# Patient Record
Sex: Female | Born: 1950 | Race: Black or African American | Hispanic: No | Marital: Married | Smoking: Former smoker
Health system: Southern US, Community
[De-identification: ages and names within clinical notes are randomized; demographics above are authoritative.]

## PROBLEM LIST (undated history)

## (undated) DIAGNOSIS — B009 Herpesviral infection, unspecified: Secondary | ICD-10-CM

## (undated) DIAGNOSIS — R928 Other abnormal and inconclusive findings on diagnostic imaging of breast: Secondary | ICD-10-CM

## (undated) DIAGNOSIS — M1990 Unspecified osteoarthritis, unspecified site: Secondary | ICD-10-CM

## (undated) DIAGNOSIS — Z87891 Personal history of nicotine dependence: Secondary | ICD-10-CM

## (undated) DIAGNOSIS — I10 Essential (primary) hypertension: Secondary | ICD-10-CM

## (undated) DIAGNOSIS — G500 Trigeminal neuralgia: Secondary | ICD-10-CM

## (undated) DIAGNOSIS — E669 Obesity, unspecified: Secondary | ICD-10-CM

## (undated) DIAGNOSIS — Z889 Allergy status to unspecified drugs, medicaments and biological substances status: Secondary | ICD-10-CM

## (undated) DIAGNOSIS — E785 Hyperlipidemia, unspecified: Secondary | ICD-10-CM

## (undated) DIAGNOSIS — Z1211 Encounter for screening for malignant neoplasm of colon: Secondary | ICD-10-CM

## (undated) DIAGNOSIS — R339 Retention of urine, unspecified: Secondary | ICD-10-CM

## (undated) DIAGNOSIS — E119 Type 2 diabetes mellitus without complications: Secondary | ICD-10-CM

## (undated) DIAGNOSIS — D126 Benign neoplasm of colon, unspecified: Secondary | ICD-10-CM

## (undated) DIAGNOSIS — H409 Unspecified glaucoma: Secondary | ICD-10-CM

## (undated) DIAGNOSIS — N63 Unspecified lump in unspecified breast: Secondary | ICD-10-CM

## (undated) HISTORY — DX: Other abnormal and inconclusive findings on diagnostic imaging of breast: R92.8

## (undated) HISTORY — PX: OTHER SURGICAL HISTORY: SHX169

## (undated) HISTORY — DX: Obesity, unspecified: E66.9

## (undated) HISTORY — DX: Encounter for screening for malignant neoplasm of colon: Z12.11

## (undated) HISTORY — PX: ELBOW SURGERY: SHX618

## (undated) HISTORY — DX: Trigeminal neuralgia: G50.0

## (undated) HISTORY — PX: TOE SURGERY: SHX1073

## (undated) HISTORY — DX: Retention of urine, unspecified: R33.9

## (undated) HISTORY — DX: Hyperlipidemia, unspecified: E78.5

## (undated) HISTORY — PX: ABDOMINAL HYSTERECTOMY: SHX81

## (undated) HISTORY — PX: TONSILLECTOMY: SUR1361

## (undated) HISTORY — PX: ROTATOR CUFF REPAIR: SHX139

## (undated) HISTORY — PX: DESTRUCTION TRIGEMINAL NERVE VIA NEUROLYTIC AGENT: SUR416

## (undated) HISTORY — DX: Unspecified lump in unspecified breast: N63.0

## (undated) HISTORY — DX: Personal history of nicotine dependence: Z87.891

---

## 1978-05-17 HISTORY — PX: BREAST BIOPSY: SHX20

## 1988-05-17 HISTORY — PX: BREAST BIOPSY: SHX20

## 1996-05-17 HISTORY — PX: KNEE SURGERY: SHX244

## 1997-11-11 ENCOUNTER — Encounter: Admission: RE | Admit: 1997-11-11 | Discharge: 1997-11-11 | Payer: Self-pay | Admitting: *Deleted

## 1998-05-17 DIAGNOSIS — G500 Trigeminal neuralgia: Secondary | ICD-10-CM

## 1998-05-17 HISTORY — PX: BRAIN SURGERY: SHX531

## 1998-05-17 HISTORY — DX: Trigeminal neuralgia: G50.0

## 1998-06-13 ENCOUNTER — Encounter: Payer: Self-pay | Admitting: *Deleted

## 1998-06-13 ENCOUNTER — Ambulatory Visit (HOSPITAL_COMMUNITY): Admission: RE | Admit: 1998-06-13 | Discharge: 1998-06-13 | Payer: Self-pay | Admitting: *Deleted

## 1998-06-18 ENCOUNTER — Encounter: Payer: Self-pay | Admitting: *Deleted

## 1998-06-18 ENCOUNTER — Ambulatory Visit (HOSPITAL_COMMUNITY): Admission: RE | Admit: 1998-06-18 | Discharge: 1998-06-18 | Payer: Self-pay | Admitting: *Deleted

## 1998-06-26 ENCOUNTER — Ambulatory Visit (HOSPITAL_COMMUNITY): Admission: RE | Admit: 1998-06-26 | Discharge: 1998-06-26 | Payer: Self-pay | Admitting: General Surgery

## 1998-06-26 ENCOUNTER — Encounter: Payer: Self-pay | Admitting: General Surgery

## 1998-07-20 ENCOUNTER — Encounter: Payer: Self-pay | Admitting: Neurosurgery

## 1998-07-20 ENCOUNTER — Ambulatory Visit (HOSPITAL_COMMUNITY): Admission: RE | Admit: 1998-07-20 | Discharge: 1998-07-20 | Payer: Self-pay | Admitting: Neurosurgery

## 1998-09-15 ENCOUNTER — Encounter: Payer: Self-pay | Admitting: Emergency Medicine

## 1998-09-15 ENCOUNTER — Emergency Department (HOSPITAL_COMMUNITY): Admission: EM | Admit: 1998-09-15 | Discharge: 1998-09-15 | Payer: Self-pay | Admitting: Emergency Medicine

## 1998-12-02 ENCOUNTER — Encounter: Payer: Self-pay | Admitting: Neurosurgery

## 1998-12-04 ENCOUNTER — Inpatient Hospital Stay (HOSPITAL_COMMUNITY): Admission: RE | Admit: 1998-12-04 | Discharge: 1998-12-07 | Payer: Self-pay | Admitting: Neurosurgery

## 1999-06-12 ENCOUNTER — Emergency Department (HOSPITAL_COMMUNITY): Admission: EM | Admit: 1999-06-12 | Discharge: 1999-06-13 | Payer: Self-pay | Admitting: Emergency Medicine

## 1999-06-13 ENCOUNTER — Emergency Department (HOSPITAL_COMMUNITY): Admission: EM | Admit: 1999-06-13 | Discharge: 1999-06-14 | Payer: Self-pay | Admitting: Emergency Medicine

## 1999-06-14 ENCOUNTER — Encounter: Payer: Self-pay | Admitting: Emergency Medicine

## 1999-07-02 ENCOUNTER — Encounter: Payer: Self-pay | Admitting: Family Medicine

## 1999-07-02 ENCOUNTER — Encounter: Admission: RE | Admit: 1999-07-02 | Discharge: 1999-07-02 | Payer: Self-pay | Admitting: Family Medicine

## 1999-09-07 ENCOUNTER — Encounter: Admission: RE | Admit: 1999-09-07 | Discharge: 1999-10-19 | Payer: Self-pay | Admitting: *Deleted

## 2000-01-26 ENCOUNTER — Ambulatory Visit (HOSPITAL_BASED_OUTPATIENT_CLINIC_OR_DEPARTMENT_OTHER): Admission: RE | Admit: 2000-01-26 | Discharge: 2000-01-26 | Payer: Self-pay | Admitting: Orthopedic Surgery

## 2000-12-29 ENCOUNTER — Ambulatory Visit (HOSPITAL_BASED_OUTPATIENT_CLINIC_OR_DEPARTMENT_OTHER): Admission: RE | Admit: 2000-12-29 | Discharge: 2000-12-29 | Payer: Self-pay | Admitting: Orthopedic Surgery

## 2001-12-21 ENCOUNTER — Encounter: Payer: Self-pay | Admitting: Family Medicine

## 2001-12-21 ENCOUNTER — Ambulatory Visit (HOSPITAL_COMMUNITY): Admission: RE | Admit: 2001-12-21 | Discharge: 2001-12-21 | Payer: Self-pay | Admitting: Family Medicine

## 2003-02-21 ENCOUNTER — Encounter: Admission: RE | Admit: 2003-02-21 | Discharge: 2003-04-04 | Payer: Self-pay | Admitting: Orthopedic Surgery

## 2003-04-10 ENCOUNTER — Ambulatory Visit (HOSPITAL_COMMUNITY): Admission: RE | Admit: 2003-04-10 | Discharge: 2003-04-10 | Payer: Self-pay | Admitting: Family Medicine

## 2003-06-13 ENCOUNTER — Ambulatory Visit (HOSPITAL_COMMUNITY): Admission: RE | Admit: 2003-06-13 | Discharge: 2003-06-13 | Payer: Self-pay | Admitting: Nurse Practitioner

## 2004-01-24 ENCOUNTER — Ambulatory Visit: Payer: Self-pay | Admitting: *Deleted

## 2004-02-06 ENCOUNTER — Ambulatory Visit: Payer: Self-pay | Admitting: Nurse Practitioner

## 2004-02-27 ENCOUNTER — Ambulatory Visit: Payer: Self-pay | Admitting: *Deleted

## 2004-03-16 ENCOUNTER — Ambulatory Visit: Payer: Self-pay | Admitting: Nurse Practitioner

## 2004-03-27 ENCOUNTER — Ambulatory Visit: Payer: Self-pay | Admitting: Nurse Practitioner

## 2004-04-06 ENCOUNTER — Ambulatory Visit: Payer: Self-pay | Admitting: Nurse Practitioner

## 2004-04-15 ENCOUNTER — Ambulatory Visit (HOSPITAL_COMMUNITY): Admission: RE | Admit: 2004-04-15 | Discharge: 2004-04-15 | Payer: Self-pay | Admitting: Family Medicine

## 2004-07-02 ENCOUNTER — Ambulatory Visit: Payer: Self-pay | Admitting: Nurse Practitioner

## 2004-07-07 ENCOUNTER — Ambulatory Visit: Payer: Self-pay | Admitting: Nurse Practitioner

## 2004-07-09 ENCOUNTER — Ambulatory Visit: Payer: Self-pay | Admitting: Nurse Practitioner

## 2004-08-03 ENCOUNTER — Ambulatory Visit: Payer: Self-pay | Admitting: Nurse Practitioner

## 2004-08-25 ENCOUNTER — Ambulatory Visit: Payer: Self-pay | Admitting: Nurse Practitioner

## 2004-11-05 ENCOUNTER — Ambulatory Visit: Payer: Self-pay | Admitting: Nurse Practitioner

## 2005-04-14 ENCOUNTER — Ambulatory Visit: Payer: Self-pay | Admitting: Nurse Practitioner

## 2005-04-23 ENCOUNTER — Ambulatory Visit (HOSPITAL_COMMUNITY): Admission: RE | Admit: 2005-04-23 | Discharge: 2005-04-23 | Payer: Self-pay | Admitting: Internal Medicine

## 2005-07-19 ENCOUNTER — Ambulatory Visit: Payer: Self-pay | Admitting: Nurse Practitioner

## 2005-07-27 ENCOUNTER — Ambulatory Visit: Payer: Self-pay | Admitting: Nurse Practitioner

## 2005-08-11 ENCOUNTER — Emergency Department (HOSPITAL_COMMUNITY): Admission: EM | Admit: 2005-08-11 | Discharge: 2005-08-11 | Payer: Self-pay | Admitting: Family Medicine

## 2006-01-27 ENCOUNTER — Ambulatory Visit: Payer: Self-pay | Admitting: Nurse Practitioner

## 2006-01-29 ENCOUNTER — Ambulatory Visit (HOSPITAL_COMMUNITY): Admission: RE | Admit: 2006-01-29 | Discharge: 2006-01-29 | Payer: Self-pay | Admitting: Nurse Practitioner

## 2006-02-25 ENCOUNTER — Ambulatory Visit: Payer: Self-pay | Admitting: Nurse Practitioner

## 2006-02-28 ENCOUNTER — Ambulatory Visit (HOSPITAL_COMMUNITY): Admission: RE | Admit: 2006-02-28 | Discharge: 2006-02-28 | Payer: Self-pay | Admitting: Family Medicine

## 2006-05-17 HISTORY — PX: COLONOSCOPY: SHX174

## 2006-05-30 ENCOUNTER — Ambulatory Visit (HOSPITAL_COMMUNITY): Admission: RE | Admit: 2006-05-30 | Discharge: 2006-05-30 | Payer: Self-pay | Admitting: Nurse Practitioner

## 2006-09-14 ENCOUNTER — Encounter (INDEPENDENT_AMBULATORY_CARE_PROVIDER_SITE_OTHER): Payer: Self-pay | Admitting: Nurse Practitioner

## 2006-09-14 ENCOUNTER — Ambulatory Visit: Payer: Self-pay | Admitting: Nurse Practitioner

## 2006-10-04 ENCOUNTER — Ambulatory Visit: Payer: Self-pay | Admitting: Nurse Practitioner

## 2006-12-22 ENCOUNTER — Ambulatory Visit: Payer: Self-pay | Admitting: Internal Medicine

## 2006-12-27 ENCOUNTER — Emergency Department (HOSPITAL_COMMUNITY): Admission: EM | Admit: 2006-12-27 | Discharge: 2006-12-27 | Payer: Self-pay | Admitting: Emergency Medicine

## 2007-01-05 ENCOUNTER — Ambulatory Visit: Payer: Self-pay | Admitting: Family Medicine

## 2007-02-01 ENCOUNTER — Encounter (INDEPENDENT_AMBULATORY_CARE_PROVIDER_SITE_OTHER): Payer: Self-pay | Admitting: *Deleted

## 2007-04-17 ENCOUNTER — Ambulatory Visit (HOSPITAL_COMMUNITY): Admission: RE | Admit: 2007-04-17 | Discharge: 2007-04-17 | Payer: Self-pay | Admitting: Internal Medicine

## 2007-04-17 ENCOUNTER — Ambulatory Visit: Payer: Self-pay | Admitting: Internal Medicine

## 2007-04-26 ENCOUNTER — Ambulatory Visit: Payer: Self-pay | Admitting: Family Medicine

## 2007-05-23 ENCOUNTER — Ambulatory Visit: Payer: Self-pay | Admitting: Family Medicine

## 2007-06-15 ENCOUNTER — Ambulatory Visit (HOSPITAL_COMMUNITY): Admission: RE | Admit: 2007-06-15 | Discharge: 2007-06-15 | Payer: Self-pay | Admitting: Family Medicine

## 2007-06-29 ENCOUNTER — Encounter: Admission: RE | Admit: 2007-06-29 | Discharge: 2007-06-29 | Payer: Self-pay | Admitting: Family Medicine

## 2007-07-04 ENCOUNTER — Ambulatory Visit: Payer: Self-pay | Admitting: Family Medicine

## 2007-07-06 ENCOUNTER — Ambulatory Visit (HOSPITAL_COMMUNITY): Admission: RE | Admit: 2007-07-06 | Discharge: 2007-07-06 | Payer: Self-pay | Admitting: Family Medicine

## 2007-12-20 ENCOUNTER — Encounter: Admission: RE | Admit: 2007-12-20 | Discharge: 2007-12-20 | Payer: Self-pay | Admitting: Family Medicine

## 2008-05-17 HISTORY — PX: HAND SURGERY: SHX662

## 2008-06-03 ENCOUNTER — Ambulatory Visit: Payer: Self-pay | Admitting: Gastroenterology

## 2008-06-21 ENCOUNTER — Encounter: Admission: RE | Admit: 2008-06-21 | Discharge: 2008-06-21 | Payer: Self-pay | Admitting: Family Medicine

## 2009-05-17 DIAGNOSIS — R339 Retention of urine, unspecified: Secondary | ICD-10-CM

## 2009-05-17 HISTORY — PX: HAND SURGERY: SHX662

## 2009-05-17 HISTORY — DX: Retention of urine, unspecified: R33.9

## 2009-07-18 ENCOUNTER — Encounter: Admission: RE | Admit: 2009-07-18 | Discharge: 2009-07-18 | Payer: Self-pay | Admitting: Family Medicine

## 2010-06-07 ENCOUNTER — Encounter: Payer: Self-pay | Admitting: Nurse Practitioner

## 2010-08-03 ENCOUNTER — Ambulatory Visit: Payer: Self-pay | Admitting: Family Medicine

## 2010-10-02 ENCOUNTER — Ambulatory Visit: Payer: Self-pay | Admitting: Orthopedic Surgery

## 2010-10-02 NOTE — Op Note (Signed)
. [facility]  Patient:    Desiree Oneal, Desiree Oneal                     MRN: [id number] Proc. Date: 01/26/00 Adm. Date:  [id number] Attending:  Ronne Binning CC:         Nicki Reaper, M.D. (2 copies)   Operative Report  PREOPERATIVE DIAGNOSIS:  Carpal tunnel syndrome, left hand.  POSTOPERATIVE DIAGNOSIS:  Carpal tunnel syndrome, left hand.  OPERATION:  Decompression, left median nerve.  SURGEON:  Nicki Reaper, M.D.  ASSISTANT:  Joaquin Courts, R.N.  ANESTHESIA:  Forearm-based IV regional.  ANESTHESIOLOGIST:  Cliffton Asters. Ivin Booty, M.D.  HISTORY:  The patient is a 60 year old female with a history of carpal tunnel syndrome.  EMG nerve conductions were positive which has not responded to conservative treatment.  DESCRIPTION OF PROCEDURE:  The patient was brought to the operating room where a forearm-based IV regional anesthetic was carried out without difficultly. She was prepped and draped using Betadine scrub and solution with left arm free.  A longitudinal incision was made in the palm and carried down through subcutaneous tissue.  Bleeders were electrocauterized.  Palmar fascia was split, superficial palmar arch identified.  The flexor tendon to the ring and little finger identified to the ulnar side of the median nerve.  The carpal retinaculum was incised with sharp dissection.  A right angle and Sewall retractor were placed between skin and forearm fascia and the fascia released for approximately 3 cm proximal to the wrist crease under direct vision.  The canal was explored.  No further lesions were identified.  The wound was irrigated.  The skin was closed with interrupted 5-0 nylon sutures.  A sterile compressive dressing and splint was applied.  The patient tolerated the procedure well and was taken to the recovery room for observation in satisfactory condition.  She is discharged home to return to the [facility] in [place] in one week on  Vicodin and Keflex.  She was advised she could return to one-handed work. DD:  01/26/00 TD:  01/27/00 Job: [id number]

## 2010-10-06 LAB — PATHOLOGY REPORT

## 2011-05-18 DIAGNOSIS — N63 Unspecified lump in unspecified breast: Secondary | ICD-10-CM

## 2011-05-18 HISTORY — DX: Unspecified lump in unspecified breast: N63.0

## 2012-02-08 ENCOUNTER — Ambulatory Visit: Payer: Self-pay | Admitting: Family Medicine

## 2012-02-11 ENCOUNTER — Ambulatory Visit: Payer: Self-pay | Admitting: Family Medicine

## 2012-06-09 HISTORY — PX: TRIGGER FINGER RELEASE: SHX641

## 2012-07-05 ENCOUNTER — Ambulatory Visit: Payer: Self-pay | Admitting: General Surgery

## 2012-08-21 ENCOUNTER — Ambulatory Visit: Payer: Self-pay | Admitting: Orthopedic Surgery

## 2012-10-25 ENCOUNTER — Encounter: Payer: Self-pay | Admitting: *Deleted

## 2012-10-25 ENCOUNTER — Ambulatory Visit: Payer: Self-pay | Admitting: Physical Medicine and Rehabilitation

## 2013-02-22 ENCOUNTER — Ambulatory Visit: Payer: Self-pay | Admitting: General Surgery

## 2013-03-13 ENCOUNTER — Ambulatory Visit: Payer: Self-pay | Admitting: General Surgery

## 2013-03-13 ENCOUNTER — Encounter: Payer: Self-pay | Admitting: General Surgery

## 2013-04-09 ENCOUNTER — Ambulatory Visit: Payer: Self-pay | Admitting: General Surgery

## 2013-04-11 ENCOUNTER — Ambulatory Visit (INDEPENDENT_AMBULATORY_CARE_PROVIDER_SITE_OTHER): Payer: 59 | Admitting: General Surgery

## 2013-04-11 ENCOUNTER — Encounter: Payer: Self-pay | Admitting: General Surgery

## 2013-04-11 VITALS — BP 150/84 | HR 80 | Resp 12 | Ht 64.0 in | Wt 206.0 lb

## 2013-04-11 DIAGNOSIS — Z1239 Encounter for other screening for malignant neoplasm of breast: Secondary | ICD-10-CM

## 2013-04-11 DIAGNOSIS — Z803 Family history of malignant neoplasm of breast: Secondary | ICD-10-CM | POA: Insufficient documentation

## 2013-04-11 NOTE — Patient Instructions (Signed)
Patient to return in October 2015 bilateral screening mammogram. Patient to conutine to do monthly self breast checks.

## 2013-04-11 NOTE — Progress Notes (Signed)
Patient ID: Desiree Oneal, female   DOB: July 28, 1950, 62 y.o.   MRN: [id number]  Chief Complaint  Patient presents with  . Follow-up    mammogram    HPI Desiree Oneal is a 62 y.o. female who presents for a breast evaluation. The most recent mammogram was done on 03/13/13.Patient has small left breast nodule that has not change Patient does perform regular self breast checks and gets regular mammograms done.  Patient has a sister with breast cancer diagnosed recently   HPI  Past Medical History  Diagnosis Date  . Personal history of tobacco use, presenting hazards to health   . Abnormal mammogram, unspecified   . Lump or mass in breast 2013  . Special screening for malignant neoplasms, colon   . Obesity, unspecified     Past Surgical History  Procedure Laterality Date  . Colonoscopy  2008    Dr. Mechele Collin  . Breast biopsy Right 1980    benign  . Breast biopsy Left 1990    benign  . Knee surgery Right 1998  . Brain surgery  2000  . Hand surgery Left 2010  . Hand surgery Right 2011    Family History  Problem Relation Age of Onset  . Breast cancer Sister   . Breast cancer Maternal Grandmother   . Breast cancer Cousin     maternal    Social History History  Substance Use Topics  . Smoking status: Former Smoker -- 0.50 packs/day for 20 years  . Smokeless tobacco: Never Used  . Alcohol Use: No    No Known Allergies  Current Outpatient Prescriptions  Medication Sig Dispense Refill  . acyclovir (ZOVIRAX) 200 MG capsule Take 200 mg by mouth 2 (two) times daily.       . carbamazepine (TEGRETOL) 200 MG tablet Take 200 mg by mouth 3 (three) times daily.       Marland Kitchen oxybutynin (DITROPAN-XL) 10 MG 24 hr tablet Take 10 mg by mouth at bedtime.       . pravastatin (PRAVACHOL) 40 MG tablet       . zolpidem (AMBIEN) 10 MG tablet Take 10 mg by mouth.        No current facility-administered medications for this visit.    Review of Systems Review of Systems  Constitutional:  Negative.   Respiratory: Negative.   Cardiovascular: Negative.     Blood pressure 150/84, pulse 80, resp. rate 12, height 5\' 4"  (1.626 m), weight 206 lb (93.441 kg).  Physical Exam Physical Exam  Vitals reviewed. Constitutional: She is oriented to person, place, and time. She appears well-developed and well-nourished.  Eyes: Conjunctivae are normal. No scleral icterus.  Neck: Neck supple. No mass and no thyromegaly present.  Cardiovascular: Normal rate, regular rhythm and normal heart sounds.   Pulmonary/Chest: Breath sounds normal. Right breast exhibits no inverted nipple, no mass, no nipple discharge, no skin change and no tenderness. Left breast exhibits no inverted nipple, no mass, no nipple discharge, no skin change and no tenderness.  Abdominal: Soft. Normal appearance and bowel sounds are normal.  Lymphadenopathy:    She has no cervical adenopathy.    She has no axillary adenopathy.  Neurological: She is alert and oriented to person, place, and time.  Skin: Skin is warm and dry.    Data Reviewed Mammogram Reviewed-stable small nodule left breast  Assessment    Stable exam     Plan    Patient to return in October 2015 bilateral screening mammogram.  Aela Bohan G 04/11/2013, 6:16 PM

## 2014-02-06 ENCOUNTER — Ambulatory Visit: Payer: 59 | Admitting: Podiatry

## 2014-02-11 ENCOUNTER — Encounter: Payer: Self-pay | Admitting: Podiatry

## 2014-02-11 ENCOUNTER — Ambulatory Visit (INDEPENDENT_AMBULATORY_CARE_PROVIDER_SITE_OTHER): Payer: 59 | Admitting: Podiatry

## 2014-02-11 VITALS — BP 116/86 | HR 82 | Resp 12

## 2014-02-11 DIAGNOSIS — B351 Tinea unguium: Secondary | ICD-10-CM

## 2014-02-11 DIAGNOSIS — B353 Tinea pedis: Secondary | ICD-10-CM

## 2014-02-11 NOTE — Progress Notes (Signed)
   Subjective:    Patient ID: Desiree Oneal, female    DOB: 04-18-1951, 63 y.o.   MRN: [id number]  HPI N-ITCHING, WHITE DISCOLORATION L-LT FOOT BETWEEN 1ST AND 2ND D- 4 MONTHS C-WORSE O-SLOWLY A-SHOES T-NEOSPORIN, antifungal medication and cortisone cream with no improvement   N- THICK, DISCOLORATION L-RT FOOT 1-4 TOENAILS D-1 YEAR O-SLOWLY C-WORSE A-NONE T-NO TREATMENT   Review of Systems  All other systems reviewed and are negative.      Objective:   Physical Exam Orientated x3  Vascular: DP and PT pulses 2/4 bilaterally  Neurological: Sensation to 10 g monofilament wire intact 4/5 bilaterally Vibratory sensation intact bilaterally Ankle reflex equal and reactive bilaterally  Dermatological: Macerated white  skin first left interdigital  space Toenails 1-5 are hypertrophic, elongated, discolored Surgical incision fifth right toe       Assessment & Plan:   Assessment: Probable yeast infection for first left web space Onychomycoses one-5 right  Plan: Dispense Castellani paint to apply to the first web space twice a day Nail fragments obtained right hallux for PAS and fungal culture  Notify patient on receipt of lab

## 2014-02-11 NOTE — Patient Instructions (Signed)
Apply Castellaini paint twice a day to the webspace between the left great toe and second toe

## 2014-02-12 ENCOUNTER — Encounter: Payer: Self-pay | Admitting: Podiatry

## 2014-02-22 ENCOUNTER — Telehealth: Payer: Self-pay | Admitting: *Deleted

## 2014-02-22 ENCOUNTER — Emergency Department (HOSPITAL_COMMUNITY)
Admission: EM | Admit: 2014-02-22 | Discharge: 2014-02-22 | Disposition: A | Discharge status: Home / Self Care | Payer: 59

## 2014-02-22 NOTE — Telephone Encounter (Signed)
I would like to see if I can get an antibiotic to clear up my infection on my feet.  I was seen a couple of weeks ago.  I'm supposed to have a fungus and with this burgundy dye I'm putting in between my toes.  It's spreading to the other toes and it is splitting real bad on top.  Is there any way possible to get something for infection?  Please give me a call back.  I called and left her a message to call us and schedule an appointment for today to see another doctor.  Dr. Amalia Hailey is not here today.

## 2014-02-22 NOTE — ED Notes (Signed)
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 Both expressed understanding and left via POV.

## 2014-02-28 ENCOUNTER — Ambulatory Visit (INDEPENDENT_AMBULATORY_CARE_PROVIDER_SITE_OTHER): Payer: 59 | Admitting: Podiatry

## 2014-02-28 ENCOUNTER — Encounter: Payer: Self-pay | Admitting: Podiatry

## 2014-02-28 VITALS — BP 134/80 | HR 85 | Resp 16

## 2014-02-28 DIAGNOSIS — B372 Candidiasis of skin and nail: Secondary | ICD-10-CM

## 2014-02-28 DIAGNOSIS — L03119 Cellulitis of unspecified part of limb: Secondary | ICD-10-CM

## 2014-02-28 LAB — CBC WITH DIFFERENTIAL/PLATELET
Basophils Absolute: 0 10*3/uL (ref 0.0–0.1)
Basophils Relative: 0 % (ref 0–1)
Eosinophils Absolute: 0.1 10*3/uL (ref 0.0–0.7)
Eosinophils Relative: 2 % (ref 0–5)
HEMATOCRIT: 39.4 % (ref 36.0–46.0)
HEMOGLOBIN: 13.2 g/dL (ref 12.0–15.0)
Lymphocytes Relative: 52 % — ABNORMAL HIGH (ref 12–46)
Lymphs Abs: 3.6 10*3/uL (ref 0.7–4.0)
MCH: 29 pg (ref 26.0–34.0)
MCHC: 33.5 g/dL (ref 30.0–36.0)
MCV: 86.6 fL (ref 78.0–100.0)
MONOS PCT: 8 % (ref 3–12)
Monocytes Absolute: 0.6 10*3/uL (ref 0.1–1.0)
NEUTROS ABS: 2.6 10*3/uL (ref 1.7–7.7)
Neutrophils Relative %: 38 % — ABNORMAL LOW (ref 43–77)
Platelets: 305 10*3/uL (ref 150–400)
RBC: 4.55 MIL/uL (ref 3.87–5.11)
RDW: 14.7 % (ref 11.5–15.5)
WBC: 6.9 10*3/uL (ref 4.0–10.5)

## 2014-02-28 MED ORDER — METHYLPREDNISOLONE 4 MG PO KIT: PACK | ORAL | Status: DC

## 2014-02-28 MED ORDER — CEPHALEXIN 500 MG PO CAPS: 500.0000 mg | ORAL_CAPSULE | Freq: Three times a day (TID) | ORAL | Status: DC

## 2014-02-28 MED ORDER — TERBINAFINE HCL 250 MG PO TABS: 250.0000 mg | ORAL_TABLET | Freq: Every day | ORAL | Status: DC

## 2014-03-03 NOTE — Progress Notes (Signed)
Subjective:     Patient ID: Desiree Oneal, female   DOB: 01-03-1951, 63 y.o.   MRN: [id number]  HPI patient presents stating she still getting some irritation between the big toe and the second toe of her left foot that is localized with no proximal redness that is irritating to her and she wanted to see what else can be done   Review of Systems     Objective:   Physical Exam Neurovascular status intact with an area of crusted tissue between the hallux and second toe left foot that has white type discoloration but no proximal edema erythema or drainage noted.    Assessment:     Probable fungal infection of the left first interspace with no indications currently of bacterial infection the patient is very worried about the appearance of this area    Plan:     At this time I educated her on this type of infection and advised that we try to improve it and if it does not get better she needs to seek a dermatologist for opinion. Today I did place her on Lamisil 250 mg daily a steroid Dosepak for the itching and inflammation she is experiencing and as precautionary measure I did place her on cephalexin 500 mg 3 times a day. She just had a liver function study done from her physician and I did go ahead and I send her out for a CBC to make sure there is no elevated count. I gave her strict instructions if this does not improve or were to get any proximal edema erythema or drainage to contact us immediately go to the emergency room for check

## 2014-03-11 ENCOUNTER — Encounter: Payer: Self-pay | Admitting: Podiatry

## 2014-03-18 ENCOUNTER — Encounter: Payer: Self-pay | Admitting: Podiatry

## 2014-03-21 ENCOUNTER — Ambulatory Visit: Payer: 59 | Admitting: General Surgery

## 2014-03-28 ENCOUNTER — Encounter: Payer: Self-pay | Admitting: *Deleted

## 2014-04-19 ENCOUNTER — Telehealth: Payer: Self-pay | Admitting: *Deleted

## 2014-04-19 ENCOUNTER — Other Ambulatory Visit: Payer: Self-pay | Admitting: *Deleted

## 2014-04-19 DIAGNOSIS — B353 Tinea pedis: Secondary | ICD-10-CM

## 2014-04-19 MED ORDER — CEPHALEXIN 500 MG PO CAPS: 500.0000 mg | ORAL_CAPSULE | Freq: Three times a day (TID) | ORAL | Status: DC

## 2014-04-19 MED ORDER — METHYLPREDNISOLONE 4 MG PO KIT: PACK | ORAL | Status: DC

## 2014-04-19 MED ORDER — TERBINAFINE HCL 250 MG PO TABS: 250.0000 mg | ORAL_TABLET | Freq: Every day | ORAL | Status: DC

## 2014-04-19 NOTE — Telephone Encounter (Signed)
Patient called stating that her foot is splitting again and is sore. What should she do?

## 2014-04-19 NOTE — Telephone Encounter (Signed)
Returned call-advised that she needed to see a dermatologist since the problem kept recurring. Told her we would work on the referral and will call in the same meds Dr. Paulla Dolly gave her before in the meantime. Will cantact her with referral appointment and send in cephalexin, lamisil and dose pack to [facility].

## 2014-04-26 ENCOUNTER — Telehealth: Payer: Self-pay | Admitting: *Deleted

## 2014-04-26 NOTE — Telephone Encounter (Signed)
Per Dr. Paulla Dolly, I called to schedule the patient an appointment at [facility] for [place].  "We don't have anything until mid January.  Is that okay?"  I told her that is fine.  "We can get her in on 05/29/2014 at 10:30 am with Dr. Denna Haggard." I told her that is fine.  I called and left the patient a message about the appointment.  Call if you have any questions.

## 2014-05-24 ENCOUNTER — Other Ambulatory Visit: Payer: Self-pay | Admitting: Podiatry

## 2014-11-13 ENCOUNTER — Encounter: Payer: Self-pay | Admitting: General Surgery

## 2014-11-13 ENCOUNTER — Telehealth: Payer: Self-pay | Admitting: *Deleted

## 2014-11-13 NOTE — Telephone Encounter (Signed)
She had called complaining of left breast pain and was asking which breast had had been of concern in the past. Chart reviewed. Appt made for 11-29-14 as requested.

## 2014-11-26 ENCOUNTER — Ambulatory Visit: Payer: Self-pay | Admitting: General Surgery

## 2014-12-12 ENCOUNTER — Ambulatory Visit: Payer: Self-pay | Admitting: General Surgery

## 2015-01-21 ENCOUNTER — Encounter: Payer: Self-pay | Admitting: *Deleted

## 2015-02-05 ENCOUNTER — Ambulatory Visit: Payer: Self-pay | Admitting: General Surgery

## 2015-02-25 ENCOUNTER — Ambulatory Visit: Payer: Self-pay | Admitting: General Surgery

## 2015-02-27 ENCOUNTER — Ambulatory Visit: Payer: Self-pay | Admitting: General Surgery

## 2015-02-27 ENCOUNTER — Ambulatory Visit (INDEPENDENT_AMBULATORY_CARE_PROVIDER_SITE_OTHER): Payer: BC Managed Care – PPO | Admitting: General Surgery

## 2015-02-27 ENCOUNTER — Encounter: Payer: Self-pay | Admitting: General Surgery

## 2015-02-27 ENCOUNTER — Other Ambulatory Visit: Payer: BC Managed Care – PPO

## 2015-02-27 VITALS — BP 144/82 | HR 84 | Resp 14 | Ht 63.0 in | Wt 215.0 lb

## 2015-02-27 DIAGNOSIS — Z803 Family history of malignant neoplasm of breast: Secondary | ICD-10-CM | POA: Diagnosis not present

## 2015-02-27 DIAGNOSIS — Z1239 Encounter for other screening for malignant neoplasm of breast: Secondary | ICD-10-CM | POA: Diagnosis not present

## 2015-02-27 DIAGNOSIS — N644 Mastodynia: Secondary | ICD-10-CM

## 2015-02-27 NOTE — Patient Instructions (Signed)
The patient is aware to call back for any questions or concerns.  

## 2015-02-27 NOTE — Progress Notes (Signed)
Patient ID: Desiree Oneal, female   DOB: 07-16-1950, 64 y.o.   MRN: [id number]  Chief Complaint  Patient presents with  . Other    left breast pain    HPI Desiree Oneal is a 64 y.o. female here today for a evaluation left breast pain for several months described as "soreness". She states the pain comes and goes. She has been lifting more and traveling more. She feels like the left breast is larger. She feels "cysts" but denies any single noticeable lump. Her last mammogram was in 2014.  HPI  Past Medical History  Diagnosis Date  . Personal history of tobacco use, presenting hazards to health   . Abnormal mammogram, unspecified   . Lump or mass in breast 2013  . Special screening for malignant neoplasms, colon   . Obesity, unspecified     Past Surgical History  Procedure Laterality Date  . Colonoscopy  2008    Dr. Vira Oneal  . Breast biopsy Right 1980    benign  . Breast biopsy Left 1990    benign  . Knee surgery Right 1998  . Brain surgery  2000  . Hand surgery Left 2010  . Hand surgery Right 2011    Family History  Problem Relation Age of Onset  . Breast cancer Maternal Grandmother   . Breast cancer Cousin     maternal    Social History Social History  Substance Use Topics  . Smoking status: Former Smoker -- 0.50 packs/day for 20 years  . Smokeless tobacco: Never Used  . Alcohol Use: No    No Known Allergies  Current Outpatient Prescriptions  Medication Sig Dispense Refill  . acyclovir (ZOVIRAX) 200 MG capsule Take by mouth.    . meloxicam (MOBIC) 15 MG tablet Take by mouth.    . oxybutynin (DITROPAN-XL) 10 MG 24 hr tablet Take 10 mg by mouth at bedtime.     . pravastatin (PRAVACHOL) 40 MG tablet Take by mouth.    . pregabalin (LYRICA) 50 MG capsule Take 50 mg by mouth as needed.    . zolpidem (AMBIEN) 10 MG tablet Take 10 mg by mouth.      No current facility-administered medications for this visit.    Review of Systems Review of Systems   Constitutional: Negative.   Respiratory: Negative.   Cardiovascular: Negative.     Blood pressure 144/82, pulse 84, resp. rate 14, height 5\' 3"  (1.6 m), weight 215 lb (97.523 kg).  Physical Exam Physical Exam  Constitutional: She is oriented to person, place, and time. She appears well-developed and well-nourished.  HENT:  Mouth/Throat: Oropharynx is clear and moist.  Eyes: Conjunctivae are normal. No scleral icterus.  Neck: Neck supple.  Cardiovascular: Normal rate, regular rhythm and normal heart sounds.   Pulmonary/Chest: Effort normal and breath sounds normal. Right breast exhibits no inverted nipple, no mass, no nipple discharge, no skin change and no tenderness. Left breast exhibits tenderness. Left breast exhibits no inverted nipple, no mass, no nipple discharge and no skin change.  Left breast: Tenderness and palpable 3-4 cm area of fullness/firm tissue stemming from subareolar region superiorly at 12 oclock location (near scar from previous incision). No defined mass.   Lymphadenopathy:    She has no cervical adenopathy.    She has no axillary adenopathy.  Neurological: She is alert and oriented to person, place, and time.  Skin: Skin is warm and dry.  Psychiatric: Her behavior is normal.    Data Reviewed Previous note.  Assessment    Targeted ultrasound today over the palpable area shows 4 mm focal area of shadowing, seen only on the anti-radial view. This is of doubtful significance and needs a short term follow-up, assuming mammogram is normal.    Plan    Bilateral screening mammogram. If normal, follow up in 3 months.  Continue self breast exams. Call office for any new breast issues or concerns.        PCP:  Desiree Oneal 02/28/2015, 4:00 PM

## 2015-02-28 ENCOUNTER — Encounter: Payer: Self-pay | Admitting: General Surgery

## 2015-03-03 ENCOUNTER — Ambulatory Visit
Admission: RE | Admit: 2015-03-03 | Discharge: 2015-03-03 | Disposition: A | Discharge status: Home / Self Care | Payer: BC Managed Care – PPO | Source: Ambulatory Visit | Attending: General Surgery | Admitting: General Surgery

## 2015-03-03 DIAGNOSIS — Z1239 Encounter for other screening for malignant neoplasm of breast: Secondary | ICD-10-CM

## 2015-03-03 DIAGNOSIS — Z1231 Encounter for screening mammogram for malignant neoplasm of breast: Secondary | ICD-10-CM | POA: Insufficient documentation

## 2015-05-15 ENCOUNTER — Ambulatory Visit: Payer: BC Managed Care – PPO | Admitting: Family Medicine

## 2015-05-28 ENCOUNTER — Telehealth: Payer: Self-pay | Admitting: *Deleted

## 2015-05-28 NOTE — Telephone Encounter (Signed)
Fax refill request for Terbinafine.  Pt has not been seen by [facility] doctor in over 1 year, rx denied until pt is reevaluated.  Return fax.

## 2015-05-30 DIAGNOSIS — M7520 Bicipital tendinitis, unspecified shoulder: Secondary | ICD-10-CM | POA: Insufficient documentation

## 2015-06-04 ENCOUNTER — Ambulatory Visit: Payer: Self-pay | Admitting: General Surgery

## 2015-06-18 ENCOUNTER — Other Ambulatory Visit: Payer: BC Managed Care – PPO

## 2015-06-18 ENCOUNTER — Encounter: Payer: Self-pay | Admitting: General Surgery

## 2015-06-18 ENCOUNTER — Ambulatory Visit (INDEPENDENT_AMBULATORY_CARE_PROVIDER_SITE_OTHER): Payer: BC Managed Care – PPO | Admitting: General Surgery

## 2015-06-18 DIAGNOSIS — N63 Unspecified lump in breast: Secondary | ICD-10-CM

## 2015-06-18 DIAGNOSIS — N6320 Unspecified lump in the left breast, unspecified quadrant: Secondary | ICD-10-CM

## 2015-06-18 NOTE — Progress Notes (Signed)
Patient ID: Desiree Oneal, female   DOB: 03-16-1951, 65 y.o.   MRN: [id number]  Chief Complaint  Patient presents with  . Follow-up    breast check    HPI Desiree Oneal is a 65 y.o. female here today following up for her three month left breast thickening. Patient states she has not noticed any changes and doing well.  HPI I have reviewed the history of present illness with the patient.  Past Medical History  Diagnosis Date  . Personal history of tobacco use, presenting hazards to health   . Abnormal mammogram, unspecified   . Lump or mass in breast 2013  . Special screening for malignant neoplasms, colon   . Obesity, unspecified     Past Surgical History  Procedure Laterality Date  . Colonoscopy  2008    Dr. Vira Agar  . Breast biopsy Right 1980    benign  . Breast biopsy Left 1990    benign  . Knee surgery Right 1998  . Brain surgery  2000  . Hand surgery Left 2010  . Hand surgery Right 2011    Family History  Problem Relation Age of Onset  . Breast cancer Maternal Grandmother   . Breast cancer Cousin     maternal    Social History Social History  Substance Use Topics  . Smoking status: Former Smoker -- 0.50 packs/day for 20 years  . Smokeless tobacco: Never Used  . Alcohol Use: No    No Known Allergies  Current Outpatient Prescriptions  Medication Sig Dispense Refill  . acyclovir (ZOVIRAX) 200 MG capsule Take by mouth.    . carbamazepine (TEGRETOL) 200 MG tablet Take by mouth.    . oxybutynin (DITROPAN-XL) 10 MG 24 hr tablet Take 10 mg by mouth at bedtime.     . pravastatin (PRAVACHOL) 40 MG tablet Take by mouth.    . pregabalin (LYRICA) 50 MG capsule Take 50 mg by mouth as needed.    . valACYclovir (VALTREX) 500 MG tablet Take by mouth.    . zolpidem (AMBIEN) 10 MG tablet Take by mouth.     No current facility-administered medications for this visit.    Review of Systems Review of Systems  Constitutional: Negative.   Respiratory: Negative.    Cardiovascular: Negative.     Blood pressure 134/62, pulse 76, resp. rate 14, height 5\' 3"  (1.6 m), weight 211 lb (95.709 kg).  Physical Exam Physical Exam  Constitutional: She is oriented to person, place, and time. She appears well-developed and well-nourished.  Cardiovascular: Normal rate and regular rhythm.   Pulmonary/Chest: Right breast exhibits no inverted nipple, no mass, no nipple discharge, no skin change and no tenderness. Left breast exhibits mass and tenderness. Left breast exhibits no inverted nipple, no nipple discharge and no skin change.  Previously noted thickening superior aspect close to the areola feels less well-defined, is still firm and somewhat irregular compared with the rest of the breast tissue.  Lymphadenopathy:    She has no cervical adenopathy.    She has no axillary adenopathy.  Neurological: She is alert and oriented to person, place, and time.  Skin: Skin is warm and dry.    Data Reviewed Old notes and mammogram reviewed. Mammogram was normal after her initial visit here on 02/27/15.  Assessment    Targeted ultrasound today over the palpable area in the left breast showed a focal area of shadowing at 12ocl, 4cm from nipple and seen only in anti-radial view. This is underlying the  previous scar. Unchanged from 3 months ago.  Given questionable finding on ultrasound, unchanged from 3 months ago, biopsy not warranted at this time. Patient is comfortable with this. Plan to re-evaluate in 3 months with ultrasound.     Plan    Patient will monitor area and follow up in 3 months.    PCP:  Kary Kos  This information has been scribed by Verlene Mayer CMA.    Maxwell Lemen G 06/20/2015, 6:03 AM

## 2015-06-18 NOTE — Patient Instructions (Signed)
Follow up in 3 months. Continue monthly self breast exams.

## 2015-06-20 ENCOUNTER — Encounter: Payer: Self-pay | Admitting: General Surgery

## 2015-06-23 ENCOUNTER — Other Ambulatory Visit: Payer: Self-pay | Admitting: Surgery

## 2015-06-23 DIAGNOSIS — M7522 Bicipital tendinitis, left shoulder: Secondary | ICD-10-CM

## 2015-07-11 ENCOUNTER — Ambulatory Visit (INDEPENDENT_AMBULATORY_CARE_PROVIDER_SITE_OTHER): Payer: Worker's Compensation | Admitting: Physician Assistant

## 2015-07-11 VITALS — BP 132/78 | HR 71 | Temp 98.2°F | Resp 18 | Ht 64.0 in | Wt 216.0 lb

## 2015-07-11 DIAGNOSIS — M79641 Pain in right hand: Secondary | ICD-10-CM | POA: Diagnosis not present

## 2015-07-11 DIAGNOSIS — E785 Hyperlipidemia, unspecified: Secondary | ICD-10-CM | POA: Insufficient documentation

## 2015-07-11 DIAGNOSIS — I10 Essential (primary) hypertension: Secondary | ICD-10-CM | POA: Insufficient documentation

## 2015-07-11 DIAGNOSIS — M1990 Unspecified osteoarthritis, unspecified site: Secondary | ICD-10-CM | POA: Insufficient documentation

## 2015-07-11 MED ORDER — HYDROCODONE-ACETAMINOPHEN 5-325 MG PO TABS: 1.0000 | ORAL_TABLET | Freq: Four times a day (QID) | ORAL | Status: DC | PRN
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
## 2015-07-11 NOTE — Patient Instructions (Signed)
You can take meloxicam 15 mg each day (either two of the 7.5 mg tablets one time each day, or one tablet twice each day).  You may use acetaminophen (Tylenol) as needed, but not at the same time as the pain medication, since there is acetaminophen IN that, too.  Wear the splint as much as you can. Remove it for bathing, hand washing, etc.
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
## 2015-07-15 ENCOUNTER — Ambulatory Visit: Payer: BC Managed Care – PPO | Attending: Surgery

## 2015-07-15 ENCOUNTER — Ambulatory Visit: Payer: BC Managed Care – PPO

## 2015-07-18 ENCOUNTER — Ambulatory Visit (INDEPENDENT_AMBULATORY_CARE_PROVIDER_SITE_OTHER): Payer: Worker's Compensation | Admitting: Internal Medicine

## 2015-07-18 VITALS — BP 122/70 | HR 91 | Temp 97.8°F | Resp 20 | Ht 64.0 in | Wt 214.4 lb

## 2015-07-18 DIAGNOSIS — G5601 Carpal tunnel syndrome, right upper limb: Secondary | ICD-10-CM | POA: Diagnosis not present

## 2015-07-18 MED ORDER — CYCLOBENZAPRINE HCL 10 MG PO TABS: 10.0000 mg | ORAL_TABLET | Freq: Every day | ORAL | Status: DC
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
## 2015-07-18 NOTE — Addendum Note (Signed)
Addended by: [name] on: 07/18/2015 02:55 PM   Modules accepted: Orders

## 2015-08-15 ENCOUNTER — Other Ambulatory Visit: Payer: Self-pay | Admitting: Internal Medicine

## 2015-09-17 ENCOUNTER — Encounter: Payer: Self-pay | Admitting: General Surgery

## 2015-09-17 ENCOUNTER — Other Ambulatory Visit: Payer: Self-pay | Admitting: Internal Medicine

## 2015-09-17 ENCOUNTER — Ambulatory Visit (INDEPENDENT_AMBULATORY_CARE_PROVIDER_SITE_OTHER): Payer: BC Managed Care – PPO | Admitting: General Surgery

## 2015-09-17 ENCOUNTER — Other Ambulatory Visit: Payer: Self-pay

## 2015-09-17 ENCOUNTER — Ambulatory Visit: Payer: BC Managed Care – PPO | Admitting: General Surgery

## 2015-09-17 VITALS — BP 132/68 | HR 92 | Resp 14 | Ht 64.0 in | Wt 208.0 lb

## 2015-09-17 DIAGNOSIS — N63 Unspecified lump in unspecified breast: Secondary | ICD-10-CM

## 2015-09-17 NOTE — Progress Notes (Signed)
Patient ID: Desiree Oneal, female   DOB: 1950/11/28, 65 y.o.   MRN: [id number]  Chief Complaint  Patient presents with  . Follow-up    Breast mass    HPI Desiree Oneal is a 65 y.o. female here today for a left breast thickening follow up.  She states she is doing well with no new breast issues. I have reviewed the history of present illness with the patient.  HPI  Past Medical History  Diagnosis Date  . Personal history of tobacco use, presenting hazards to health   . Abnormal mammogram, unspecified   . Lump or mass in breast 2013  . Special screening for malignant neoplasms, colon   . Obesity, unspecified   . Trigeminal neuralgia of left side of face 2000  . Urinary retention 2011  . Hyperlipidemia     Past Surgical History  Procedure Laterality Date  . Colonoscopy  2008    Dr. Vira Agar  . Breast biopsy Right 1980    benign  . Breast biopsy Left 1990    benign  . Knee surgery Right 1998  . Hand surgery Left 2010  . Hand surgery Right 2011  . Abdominal hysterectomy    . Brain surgery  2000    Trigeminal neuralgia    Family History  Problem Relation Age of Onset  . Breast cancer Maternal Grandmother   . Breast cancer Cousin     maternal  . Blindness Mother     Social History Social History  Substance Use Topics  . Smoking status: Former Smoker -- 1.00 packs/day for 20 years  . Smokeless tobacco: Never Used  . Alcohol Use: No    No Known Allergies  Current Outpatient Prescriptions  Medication Sig Dispense Refill  . acyclovir (ZOVIRAX) 200 MG capsule Take by mouth.    . carbamazepine (TEGRETOL) 200 MG tablet Take by mouth.    . cyclobenzaprine (FLEXERIL) 10 MG tablet TAKE ONE TABLET BY MOUTH AT BEDTIME 30 tablet 0  . meloxicam (MOBIC) 7.5 MG tablet Take 7.5 mg by mouth daily.    Marland Kitchen oxybutynin (DITROPAN-XL) 10 MG 24 hr tablet Take 10 mg by mouth at bedtime.     . pravastatin (PRAVACHOL) 40 MG tablet Take by mouth.    . valACYclovir (VALTREX) 500 MG  tablet Take by mouth.    . zolpidem (AMBIEN) 10 MG tablet Take by mouth.     No current facility-administered medications for this visit.    Review of Systems Review of Systems  Blood pressure 132/68, pulse 92, resp. rate 14, height 5\' 4"  (1.626 m), weight 208 lb (94.348 kg).  Physical Exam Physical Exam  Constitutional: She appears well-developed and well-nourished.  Eyes: Conjunctivae are normal. No scleral icterus.  Neck: Neck supple.  Pulmonary/Chest: Right breast exhibits no inverted nipple, no mass, no nipple discharge, no skin change and no tenderness. Left breast exhibits no inverted nipple, no mass, no nipple discharge, no skin change and no tenderness.    Lymphadenopathy:    She has no cervical adenopathy.    She has no axillary adenopathy.    Data Reviewed Prior notes and ultrasound  Assessment    Left breast thickening/mass appears mostly resolved  Ultrasound today showed no findings.    Plan    Follow up in 6 months with bilateral screening mammogram.       PCP: Dr. Kary Kos  This information has been scribed by Verlene Mayer, CMA.   SANKAR,SEEPLAPUTHUR G 09/17/2015, 3:01 PM

## 2015-09-17 NOTE — Patient Instructions (Signed)
Follow up in 6 months with bilateral screening mammograms.

## 2015-09-23 ENCOUNTER — Other Ambulatory Visit: Payer: Self-pay | Admitting: Internal Medicine

## 2015-12-05 ENCOUNTER — Other Ambulatory Visit: Payer: Self-pay | Admitting: Orthopedic Surgery

## 2015-12-23 ENCOUNTER — Ambulatory Visit: Payer: BC Managed Care – PPO | Admitting: Podiatry

## 2016-01-14 ENCOUNTER — Encounter (HOSPITAL_BASED_OUTPATIENT_CLINIC_OR_DEPARTMENT_OTHER): Payer: Self-pay | Admitting: *Deleted

## 2016-01-22 ENCOUNTER — Encounter (HOSPITAL_BASED_OUTPATIENT_CLINIC_OR_DEPARTMENT_OTHER)
Admission: RE | Disposition: A | Discharge status: Home / Self Care | Payer: Self-pay | Source: Ambulatory Visit | Attending: Orthopedic Surgery

## 2016-01-22 ENCOUNTER — Encounter (HOSPITAL_BASED_OUTPATIENT_CLINIC_OR_DEPARTMENT_OTHER): Payer: Self-pay | Admitting: Anesthesiology

## 2016-01-22 ENCOUNTER — Ambulatory Visit (HOSPITAL_BASED_OUTPATIENT_CLINIC_OR_DEPARTMENT_OTHER)
Admission: RE | Admit: 2016-01-22 | Discharge: 2016-01-22 | Disposition: A | Discharge status: Home / Self Care | Payer: Worker's Compensation | Source: Ambulatory Visit | Attending: Orthopedic Surgery | Admitting: Orthopedic Surgery

## 2016-01-22 ENCOUNTER — Ambulatory Visit (HOSPITAL_BASED_OUTPATIENT_CLINIC_OR_DEPARTMENT_OTHER): Payer: Worker's Compensation | Admitting: Anesthesiology

## 2016-01-22 DIAGNOSIS — G5601 Carpal tunnel syndrome, right upper limb: Secondary | ICD-10-CM | POA: Insufficient documentation

## 2016-01-22 DIAGNOSIS — Z6836 Body mass index (BMI) 36.0-36.9, adult: Secondary | ICD-10-CM | POA: Insufficient documentation

## 2016-01-22 DIAGNOSIS — G500 Trigeminal neuralgia: Secondary | ICD-10-CM | POA: Insufficient documentation

## 2016-01-22 DIAGNOSIS — E785 Hyperlipidemia, unspecified: Secondary | ICD-10-CM | POA: Insufficient documentation

## 2016-01-22 DIAGNOSIS — E669 Obesity, unspecified: Secondary | ICD-10-CM | POA: Diagnosis not present

## 2016-01-22 DIAGNOSIS — M1990 Unspecified osteoarthritis, unspecified site: Secondary | ICD-10-CM | POA: Diagnosis not present

## 2016-01-22 DIAGNOSIS — Z87891 Personal history of nicotine dependence: Secondary | ICD-10-CM | POA: Insufficient documentation

## 2016-01-22 HISTORY — PX: CARPAL TUNNEL RELEASE: SHX101

## 2016-01-22 SURGERY — CARPAL TUNNEL RELEASE
Anesthesia: Monitor Anesthesia Care | Site: Hand | Laterality: Right

## 2016-01-22 MED ORDER — LIDOCAINE 2% (20 MG/ML) 5 ML SYRINGE
INTRAMUSCULAR | Status: AC
  Filled 2016-01-22: qty 5

## 2016-01-22 MED ORDER — PHENYLEPHRINE 40 MCG/ML (10ML) SYRINGE FOR IV PUSH (FOR BLOOD PRESSURE SUPPORT)
PREFILLED_SYRINGE | INTRAVENOUS | Status: AC
  Filled 2016-01-22: qty 10

## 2016-01-22 MED ORDER — MIDAZOLAM HCL 5 MG/5ML IJ SOLN
INTRAMUSCULAR | Status: DC | PRN
  Administered 2016-01-22: 2 mg via INTRAVENOUS

## 2016-01-22 MED ORDER — LIDOCAINE HCL (PF) 0.5 % IJ SOLN
INTRAMUSCULAR | Status: AC
  Filled 2016-01-22: qty 200

## 2016-01-22 MED ORDER — CEFAZOLIN SODIUM-DEXTROSE 2-4 GM/100ML-% IV SOLN
INTRAVENOUS | Status: AC
  Filled 2016-01-22: qty 100

## 2016-01-22 MED ORDER — LIDOCAINE HCL (CARDIAC) 20 MG/ML IV SOLN
INTRAVENOUS | Status: DC | PRN
  Administered 2016-01-22: 30 mg via INTRAVENOUS

## 2016-01-22 MED ORDER — FENTANYL CITRATE (PF) 100 MCG/2ML IJ SOLN
25.0000 ug | INTRAMUSCULAR | Status: DC | PRN
  Administered 2016-01-22 (×2): 50 ug via INTRAVENOUS

## 2016-01-22 MED ORDER — KETOROLAC TROMETHAMINE 30 MG/ML IJ SOLN
INTRAMUSCULAR | Status: DC | PRN
  Administered 2016-01-22: 30 mg via INTRAVENOUS

## 2016-01-22 MED ORDER — OXYCODONE-ACETAMINOPHEN 5-325 MG PO TABS
ORAL_TABLET | ORAL | Status: AC
  Filled 2016-01-22: qty 1

## 2016-01-22 MED ORDER — FENTANYL CITRATE (PF) 100 MCG/2ML IJ SOLN
INTRAMUSCULAR | Status: DC | PRN
  Administered 2016-01-22: 100 ug via INTRAVENOUS

## 2016-01-22 MED ORDER — FENTANYL CITRATE (PF) 100 MCG/2ML IJ SOLN
INTRAMUSCULAR | Status: AC
  Filled 2016-01-22: qty 2

## 2016-01-22 MED ORDER — GLYCOPYRROLATE 0.2 MG/ML IV SOSY
PREFILLED_SYRINGE | INTRAVENOUS | Status: AC
  Filled 2016-01-22: qty 3

## 2016-01-22 MED ORDER — LACTATED RINGERS IV SOLN: INTRAVENOUS | Status: DC

## 2016-01-22 MED ORDER — DEXAMETHASONE SODIUM PHOSPHATE 10 MG/ML IJ SOLN
INTRAMUSCULAR | Status: AC
  Filled 2016-01-22: qty 1

## 2016-01-22 MED ORDER — PROMETHAZINE HCL 25 MG/ML IJ SOLN: 6.2500 mg | INTRAMUSCULAR | Status: DC | PRN

## 2016-01-22 MED ORDER — CEFAZOLIN SODIUM-DEXTROSE 2-4 GM/100ML-% IV SOLN
2.0000 g | INTRAVENOUS | Status: AC
  Administered 2016-01-22: 2 g via INTRAVENOUS

## 2016-01-22 MED ORDER — BUPIVACAINE HCL (PF) 0.25 % IJ SOLN
INTRAMUSCULAR | Status: DC | PRN
  Administered 2016-01-22: 8 mL

## 2016-01-22 MED ORDER — OXYCODONE-ACETAMINOPHEN 5-325 MG PO TABS
1.0000 | ORAL_TABLET | Freq: Once | ORAL | Status: AC | PRN
  Administered 2016-01-22: 1 via ORAL

## 2016-01-22 MED ORDER — PROPOFOL 500 MG/50ML IV EMUL
INTRAVENOUS | Status: AC
  Filled 2016-01-22: qty 50

## 2016-01-22 MED ORDER — SCOPOLAMINE 1 MG/3DAYS TD PT72: 1.0000 | MEDICATED_PATCH | Freq: Once | TRANSDERMAL | Status: DC | PRN

## 2016-01-22 MED ORDER — ONDANSETRON HCL 4 MG/2ML IJ SOLN
INTRAMUSCULAR | Status: AC
  Filled 2016-01-22: qty 2

## 2016-01-22 MED ORDER — OXYCODONE-ACETAMINOPHEN 5-325 MG PO TABS: ORAL_TABLET | ORAL | 0 refills | Status: DC

## 2016-01-22 MED ORDER — LACTATED RINGERS IV SOLN
INTRAVENOUS | Status: DC | PRN
  Administered 2016-01-22 (×2): via INTRAVENOUS

## 2016-01-22 MED ORDER — CHLORHEXIDINE GLUCONATE 4 % EX LIQD: 60.0000 mL | Freq: Once | CUTANEOUS | Status: DC

## 2016-01-22 MED ORDER — BUPIVACAINE HCL (PF) 0.25 % IJ SOLN
INTRAMUSCULAR | Status: AC
  Filled 2016-01-22: qty 120

## 2016-01-22 MED ORDER — FENTANYL CITRATE (PF) 100 MCG/2ML IJ SOLN: 50.0000 ug | INTRAMUSCULAR | Status: DC | PRN

## 2016-01-22 MED ORDER — MIDAZOLAM HCL 2 MG/2ML IJ SOLN: 1.0000 mg | INTRAMUSCULAR | Status: DC | PRN

## 2016-01-22 MED ORDER — KETOROLAC TROMETHAMINE 30 MG/ML IJ SOLN
INTRAMUSCULAR | Status: AC
  Filled 2016-01-22: qty 1

## 2016-01-22 MED ORDER — ONDANSETRON HCL 4 MG/2ML IJ SOLN
INTRAMUSCULAR | Status: DC | PRN
  Administered 2016-01-22: 4 mg via INTRAVENOUS

## 2016-01-22 MED ORDER — MIDAZOLAM HCL 2 MG/2ML IJ SOLN
INTRAMUSCULAR | Status: AC
  Filled 2016-01-22: qty 2

## 2016-01-22 MED ORDER — PROPOFOL 10 MG/ML IV BOLUS
INTRAVENOUS | Status: DC | PRN
  Administered 2016-01-22: 200 mg via INTRAVENOUS

## 2016-01-22 MED ORDER — GLYCOPYRROLATE 0.2 MG/ML IJ SOLN: 0.2000 mg | Freq: Once | INTRAMUSCULAR | Status: DC | PRN

## 2016-01-22 SURGICAL SUPPLY — 33 items
BANDAGE ACE 3X5.8 VEL STRL LF (GAUZE/BANDAGES/DRESSINGS) ×3 IMPLANT
BLADE SURG 15 STRL LF DISP TIS (BLADE) ×2 IMPLANT
BLADE SURG 15 STRL SS (BLADE) ×6
BNDG ESMARK 4X9 LF (GAUZE/BANDAGES/DRESSINGS) IMPLANT
BNDG GAUZE ELAST 4 BULKY (GAUZE/BANDAGES/DRESSINGS) ×3 IMPLANT
CHLORAPREP W/TINT 26ML (MISCELLANEOUS) ×3 IMPLANT
CORDS BIPOLAR (ELECTRODE) ×3 IMPLANT
COVER BACK TABLE 60X90IN (DRAPES) ×3 IMPLANT
COVER MAYO STAND STRL (DRAPES) ×3 IMPLANT
CUFF TOURNIQUET SINGLE 18IN (TOURNIQUET CUFF) ×3 IMPLANT
DRAPE EXTREMITY T 121X128X90 (DRAPE) ×3 IMPLANT
DRAPE SURG 17X23 STRL (DRAPES) ×3 IMPLANT
DRSG PAD ABDOMINAL 8X10 ST (GAUZE/BANDAGES/DRESSINGS) ×3 IMPLANT
GAUZE SPONGE 4X4 12PLY STRL (GAUZE/BANDAGES/DRESSINGS) ×3 IMPLANT
GAUZE XEROFORM 1X8 LF (GAUZE/BANDAGES/DRESSINGS) ×3 IMPLANT
GLOVE BIO SURGEON STRL SZ7 (GLOVE) ×3 IMPLANT
GLOVE BIO SURGEON STRL SZ7.5 (GLOVE) ×3 IMPLANT
GLOVE BIOGEL PI IND STRL 8 (GLOVE) ×1 IMPLANT
GLOVE BIOGEL PI INDICATOR 8 (GLOVE) ×2
GOWN STRL REUS W/ TWL LRG LVL3 (GOWN DISPOSABLE) ×1 IMPLANT
GOWN STRL REUS W/TWL LRG LVL3 (GOWN DISPOSABLE) ×2
GOWN STRL REUS W/TWL XL LVL3 (GOWN DISPOSABLE) ×3 IMPLANT
NEEDLE HYPO 25X1 1.5 SAFETY (NEEDLE) IMPLANT
NS IRRIG 1000ML POUR BTL (IV SOLUTION) ×3 IMPLANT
PACK BASIN DAY SURGERY FS (CUSTOM PROCEDURE TRAY) ×3 IMPLANT
PADDING CAST ABS 4INX4YD NS (CAST SUPPLIES) ×2
PADDING CAST ABS COTTON 4X4 ST (CAST SUPPLIES) ×1 IMPLANT
STOCKINETTE 4X48 STRL (DRAPES) ×3 IMPLANT
SUT ETHILON 4 0 PS 2 18 (SUTURE) ×3 IMPLANT
SYR BULB 3OZ (MISCELLANEOUS) ×3 IMPLANT
SYR CONTROL 10ML LL (SYRINGE) IMPLANT
TOWEL OR 17X24 6PK STRL BLUE (TOWEL DISPOSABLE) ×6 IMPLANT
UNDERPAD 30X30 (UNDERPADS AND DIAPERS) ×3 IMPLANT

## 2016-01-22 NOTE — Anesthesia Preprocedure Evaluation (Addendum)
Anesthesia Evaluation  Patient identified by MRN, date of birth, ID band Patient awake    Reviewed: Allergy & Precautions, NPO status , Patient's Chart, lab work & pertinent test results  History of Anesthesia Complications Negative for: history of anesthetic complications  Airway Mallampati: III  TM Distance: >3 FB Neck ROM: Full    Dental  (+) Teeth Intact, Dental Advisory Given   Pulmonary former smoker,    Pulmonary exam normal breath sounds clear to auscultation       Cardiovascular Exercise Tolerance: Good (-) hypertension(-) Past MI Normal cardiovascular exam Rhythm:Regular Rate:Normal  HLD   Neuro/Psych  Neuromuscular disease (trigeminal neuralgia) negative psych ROS   GI/Hepatic negative GI ROS, Neg liver ROS,   Endo/Other  Obesity   Renal/GU negative Renal ROS     Musculoskeletal  (+) Arthritis , Osteoarthritis,    Abdominal   Peds  Hematology negative hematology ROS (+)   Anesthesia Other Findings Day of surgery medications reviewed with the patient.  Reproductive/Obstetrics negative OB ROS                            Anesthesia Physical Anesthesia Plan  ASA: II  Anesthesia Plan: MAC and Bier Block   Post-op Pain Management:    Induction: Intravenous  Airway Management Planned: Nasal Cannula  Additional Equipment:   Intra-op Plan:   Post-operative Plan:   Informed Consent: I have reviewed the patients History and Physical, chart, labs and discussed the procedure including the risks, benefits and alternatives for the proposed anesthesia with the patient or authorized representative who has indicated his/her understanding and acceptance.   Dental advisory given  Plan Discussed with:   Anesthesia Plan Comments: (Risks/benefits of regional block discussed with patient including risk of bleeding, infection, nerve damage, and possibility of failed block.  Also  discussed backup plan of general anesthesia and associated risks.  Patient wishes to proceed.)        Anesthesia Quick Evaluation

## 2016-01-22 NOTE — Anesthesia Postprocedure Evaluation (Signed)
Anesthesia Post Note  Patient: Desiree Oneal  Procedure(s) Performed: Procedure(s) (LRB): RIGHT CARPAL TUNNEL RELEASE (Right)  Patient location during evaluation: PACU Anesthesia Type: General Level of consciousness: awake and alert Pain management: pain level controlled Vital Signs Assessment: post-procedure vital signs reviewed and stable Respiratory status: spontaneous breathing, nonlabored ventilation, respiratory function stable and patient connected to nasal cannula oxygen Cardiovascular status: blood pressure returned to baseline and stable Postop Assessment: no signs of nausea or vomiting Anesthetic complications: no    Last Vitals:  Vitals:   01/22/16 0956 01/22/16 1100  BP:  (!) 149/97  Pulse: 89 76  Resp: 15 20  Temp:  36.4 C    Last Pain:  Vitals:   01/22/16 1100  TempSrc:   PainSc: [place]mo

## 2016-01-22 NOTE — H&P (Signed)
  Desiree Oneal is an 65 y.o. female.   Chief Complaint: right carpal tunnel syndrome HPI: 65 yo female with tingling in right hand.  Positive nerve conduction studies.  Improved but not resolved with injection.  She wishes to have a right carpal tunnel release.  Allergies: No Known Allergies  Past Medical History:  Diagnosis Date  . Abnormal mammogram, unspecified   . Hyperlipidemia   . Lump or mass in breast 2013  . Obesity, unspecified   . Personal history of tobacco use, presenting hazards to health   . Special screening for malignant neoplasms, colon   . Trigeminal neuralgia of left side of face 2000  . Urinary retention 2011    Past Surgical History:  Procedure Laterality Date  . ABDOMINAL HYSTERECTOMY    . BRAIN SURGERY  2000   Trigeminal neuralgia  . BREAST BIOPSY Right 1980   benign  . BREAST BIOPSY Left 1990   benign  . COLONOSCOPY  2008   Dr. Vira Agar  . HAND SURGERY Left 2010  . HAND SURGERY Right 2011  . KNEE SURGERY Right 1998    Family History: Family History  Problem Relation Age of Onset  . Breast cancer Maternal Grandmother   . Blindness Mother   . Breast cancer Cousin     maternal    Social History:   reports that she has quit smoking. She has a 20.00 pack-year smoking history. She has never used smokeless tobacco. She reports that she does not drink alcohol or use drugs.  Medications: Medications Prior to Admission  Medication Sig Dispense Refill  . acyclovir (ZOVIRAX) 200 MG capsule Take by mouth.    . carbamazepine (TEGRETOL) 200 MG tablet Take by mouth.    . cyclobenzaprine (FLEXERIL) 10 MG tablet TAKE ONE TABLET BY MOUTH AT BEDTIME 30 tablet 0  . meloxicam (MOBIC) 7.5 MG tablet Take 7.5 mg by mouth daily.    Marland Kitchen oxybutynin (DITROPAN-XL) 10 MG 24 hr tablet Take 10 mg by mouth at bedtime.     . pravastatin (PRAVACHOL) 40 MG tablet Take by mouth.    . valACYclovir (VALTREX) 500 MG tablet Take by mouth.    . zolpidem (AMBIEN) 10 MG tablet  Take by mouth.      No results found for this or any previous visit (from the past 48 hour(s)).  No results found.   A comprehensive review of systems was negative.  Blood pressure (!) 142/81, pulse 75, temperature 98 F (36.7 C), temperature source Oral, resp. rate 18, height 5\' 4"  (1.626 m), weight 97.5 kg (215 lb), SpO2 100 %.  General appearance: alert, cooperative and appears stated age Head: Normocephalic, without obvious abnormality, atraumatic Neck: supple, symmetrical, trachea midline Resp: clear to auscultation bilaterally Cardio: regular rate and rhythm GI: non-tender Extremities: Intact sensation and capillary refill all digits.  +epl/fpl/io.  No wounds.  Pulses: 2+ and symmetric Skin: Skin color, texture, turgor normal. No rashes or lesions Neurologic: Grossly normal Incision/Wound:none  Assessment/Plan Right carpal tunnel syndrome.  Non operative and operative treatment options were discussed with the patient and patient wishes to proceed with operative treatment. Risks, benefits, and alternatives of surgery were discussed and the patient agrees with the plan of care.   Ziva Nunziata R 01/22/2016, 8:30 AM

## 2016-01-22 NOTE — Anesthesia Procedure Notes (Signed)
Procedure Name: LMA Insertion Date/Time: 01/22/2016 8:52 AM Performed by: Toula Moos L Pre-anesthesia Checklist: Patient identified, Emergency Drugs available, Suction available, Patient being monitored and Timeout performed Patient Re-evaluated:Patient Re-evaluated prior to inductionOxygen Delivery Method: Circle system utilized Preoxygenation: Pre-oxygenation with 100% oxygen Intubation Type: IV induction Ventilation: Mask ventilation without difficulty LMA: LMA inserted LMA Size: 4.0 Number of attempts: 1 Airway Equipment and Method: Bite block Placement Confirmation: positive ETCO2 Tube secured with: Tape Dental Injury: Teeth and Oropharynx as per pre-operative assessment

## 2016-01-22 NOTE — Transfer of Care (Signed)
Immediate Anesthesia Transfer of Care Note  Patient: TIMERA HOBLIT  Procedure(s) Performed: Procedure(s): RIGHT CARPAL TUNNEL RELEASE (Right)  Patient Location: PACU  Anesthesia Type:General  Level of Consciousness: awake and patient cooperative  Airway & Oxygen Therapy: Patient Spontanous Breathing and Patient connected to face mask oxygen  Post-op Assessment: Report given to RN and Post -op Vital signs reviewed and stable  Post vital signs: Reviewed and stable  Last Vitals:  Vitals:   01/22/16 0801  BP: (!) 142/81  Pulse: 75  Resp: 18  Temp: 36.7 C    Last Pain:  Vitals:   01/22/16 0801  TempSrc: Oral      Patients Stated Pain Goal: 0 ([id number])  Complications: No apparent anesthesia complications

## 2016-01-22 NOTE — Discharge Instructions (Addendum)

## 2016-01-22 NOTE — Op Note (Signed)
01/22/2016 [facility]                              OPERATIVE REPORT   PREOPERATIVE DIAGNOSIS:  Right carpal tunnel syndrome.  POSTOPERATIVE DIAGNOSIS:  Right carpal tunnel syndrome.  PROCEDURE:  Right carpal tunnel release.  SURGEON:  Leanora Cover, MD  ASSISTANT:  none.  ANESTHESIA:  General.  IV FLUIDS:  Per anesthesia flow sheet.  ESTIMATED BLOOD LOSS:  Minimal.  COMPLICATIONS:  None.  SPECIMENS:  None.  TOURNIQUET TIME:    Total Tourniquet Time Documented: Upper Arm (Right) - 15 minutes Total: Upper Arm (Right) - 15 minutes   DISPOSITION:  Stable to PACU.  LOCATION: [facility]  INDICATIONS:  65 yo female with carpal tunnel syndrome right hand.  Positive nerve conduction studies.  She wishes to have a carpal tunnel release for management of her symptoms.  Risks, benefits and alternatives of surgery were discussed including the risk of blood loss; infection; damage to nerves, vessels, tendons, ligaments, bone; failure of surgery; need for additional surgery; complications with wound healing; continued pain; recurrence of carpal tunnel syndrome; and damage to motor branch. She voiced understanding of these risks and elected to proceed.   OPERATIVE COURSE:  After being identified preoperatively by myself, the patient and I agreed upon the procedure and site of procedure.  The surgical site was marked.  The risks, benefits, and alternatives of the surgery were reviewed and she wished to proceed.  Surgical consent had been signed.  She was given IV Ancef as preoperative antibiotic prophylaxis.  She was transferred to the operating room and placed on the operating room table in supine position with the Right upper extremity on an armboard.  General was induced by Anesthesiology.  Right upper extremity was prepped and draped in normal sterile orthopaedic fashion.  A surgical pause was performed between the surgeons, anesthesia, and operating room staff,  and all were in agreement as to the patient, procedure, and site of procedure.  Tourniquet at the proximal aspect of the arm was inflated to 250 mmHg after exsanguination of the arm with an Esmarch bandage.  Incision was made over the transverse carpal ligament and carried into the subcutaneous tissues by spreading technique.  Bipolar electrocautery was used to obtain hemostasis.  The palmar fascia was sharply incised.  The transverse carpal ligament was identified and sharply incised.  It was incised distally first.  Care was taken to ensure complete decompression distally.  It was then incised proximally.  Scissors were used to split the distal aspect of the volar antebrachial fascia.  A finger was placed into the wound to ensure complete decompression, which was the case.  The nerve was examined.  It was densely adherent to the radial leaflet.  This was carefully freed up.  The motor branch was identified and was intact.  The wound was copiously irrigated with sterile saline.  It was then closed with 4-0 nylon in a horizontal mattress fashion.  It was injected with 8 mL of 0.25% plain Marcaine to aid in postoperative analgesia.  It was dressed with sterile Xeroform, 4x4s, an ABD, and wrapped with Kerlix and an Ace bandage.  Tourniquet was deflated at 15 minutes.  Fingertips were pink with brisk capillary refill after deflation of the tourniquet.  Operative drapes were broken down.  The patient was awoken from anesthesia safely.  She was transferred back to stretcher and taken to  the PACU in stable condition.  I will see her back in the office in 1 week for postoperative followup.  I will give her a prescription for percocet 5/325 1-2 po q6 hours prn pain dispense #20.    Leanora Cover, MD

## 2016-01-22 NOTE — Brief Op Note (Signed)
01/22/2016  9:15 AM  PATIENT:  Desiree Oneal  65 y.o. female  PRE-OPERATIVE DIAGNOSIS:  RIGHT CARPAL TUNNEL RELEASE  POST-OPERATIVE DIAGNOSIS:  RIGHT CARPAL TUNNEL RELEASE  PROCEDURE:  Procedure(s): RIGHT CARPAL TUNNEL RELEASE (Right)  SURGEON:  Surgeon(s) and Role:    * Leanora Cover, MD - Primary  PHYSICIAN ASSISTANT:   ASSISTANTS: none   ANESTHESIA:   general  EBL:  Total I/O In: 900 [I.V.:900] Out: -   BLOOD ADMINISTERED:none  DRAINS: none   LOCAL MEDICATIONS USED:  MARCAINE     SPECIMEN:  No Specimen  DISPOSITION OF SPECIMEN:  N/A  COUNTS:  YES  TOURNIQUET:   Total Tourniquet Time Documented: Upper Arm (Right) - 15 minutes Total: Upper Arm (Right) - 15 minutes   DICTATION: .Note written in EPIC  PLAN OF CARE: Discharge to home after PACU  PATIENT DISPOSITION:  PACU - hemodynamically stable.

## 2016-01-23 ENCOUNTER — Encounter (HOSPITAL_BASED_OUTPATIENT_CLINIC_OR_DEPARTMENT_OTHER): Payer: Self-pay | Admitting: Orthopedic Surgery

## 2016-01-26 ENCOUNTER — Other Ambulatory Visit: Payer: Self-pay | Admitting: General Surgery

## 2016-01-26 DIAGNOSIS — Z1231 Encounter for screening mammogram for malignant neoplasm of breast: Secondary | ICD-10-CM

## 2016-01-29 ENCOUNTER — Encounter: Payer: Self-pay | Admitting: *Deleted

## 2016-03-04 ENCOUNTER — Ambulatory Visit: Payer: Worker's Compensation

## 2016-03-10 ENCOUNTER — Ambulatory Visit: Payer: BC Managed Care – PPO | Admitting: General Surgery

## 2016-03-30 ENCOUNTER — Encounter: Payer: Self-pay | Admitting: *Deleted

## 2016-03-30 ENCOUNTER — Ambulatory Visit
Admission: RE | Admit: 2016-03-30 | Discharge: 2016-03-30 | Disposition: A | Discharge status: Home / Self Care | Payer: BC Managed Care – PPO | Source: Ambulatory Visit | Attending: General Surgery | Admitting: General Surgery

## 2016-03-30 DIAGNOSIS — Z1231 Encounter for screening mammogram for malignant neoplasm of breast: Secondary | ICD-10-CM | POA: Diagnosis not present

## 2016-04-05 ENCOUNTER — Ambulatory Visit: Payer: BC Managed Care – PPO | Admitting: General Surgery

## 2016-04-14 ENCOUNTER — Encounter (HOSPITAL_BASED_OUTPATIENT_CLINIC_OR_DEPARTMENT_OTHER): Payer: Self-pay | Admitting: *Deleted

## 2016-04-14 ENCOUNTER — Other Ambulatory Visit: Payer: Self-pay | Admitting: Orthopedic Surgery

## 2016-04-20 ENCOUNTER — Ambulatory Visit (HOSPITAL_BASED_OUTPATIENT_CLINIC_OR_DEPARTMENT_OTHER): Payer: BC Managed Care – PPO | Admitting: Certified Registered Nurse Anesthetist

## 2016-04-20 ENCOUNTER — Encounter (HOSPITAL_BASED_OUTPATIENT_CLINIC_OR_DEPARTMENT_OTHER)
Admission: RE | Disposition: A | Discharge status: Home / Self Care | Payer: Self-pay | Source: Ambulatory Visit | Attending: Orthopedic Surgery

## 2016-04-20 ENCOUNTER — Ambulatory Visit (HOSPITAL_BASED_OUTPATIENT_CLINIC_OR_DEPARTMENT_OTHER)
Admission: RE | Admit: 2016-04-20 | Discharge: 2016-04-20 | Disposition: A | Discharge status: Home / Self Care | Payer: BC Managed Care – PPO | Source: Ambulatory Visit | Attending: Orthopedic Surgery | Admitting: Orthopedic Surgery

## 2016-04-20 ENCOUNTER — Encounter (HOSPITAL_BASED_OUTPATIENT_CLINIC_OR_DEPARTMENT_OTHER): Payer: Self-pay | Admitting: Certified Registered Nurse Anesthetist

## 2016-04-20 DIAGNOSIS — Z6836 Body mass index (BMI) 36.0-36.9, adult: Secondary | ICD-10-CM | POA: Diagnosis not present

## 2016-04-20 DIAGNOSIS — Z821 Family history of blindness and visual loss: Secondary | ICD-10-CM | POA: Insufficient documentation

## 2016-04-20 DIAGNOSIS — Z9889 Other specified postprocedural states: Secondary | ICD-10-CM | POA: Diagnosis not present

## 2016-04-20 DIAGNOSIS — Z90710 Acquired absence of both cervix and uterus: Secondary | ICD-10-CM | POA: Insufficient documentation

## 2016-04-20 DIAGNOSIS — Z87891 Personal history of nicotine dependence: Secondary | ICD-10-CM | POA: Insufficient documentation

## 2016-04-20 DIAGNOSIS — Z79899 Other long term (current) drug therapy: Secondary | ICD-10-CM | POA: Diagnosis not present

## 2016-04-20 DIAGNOSIS — E669 Obesity, unspecified: Secondary | ICD-10-CM | POA: Insufficient documentation

## 2016-04-20 DIAGNOSIS — Z803 Family history of malignant neoplasm of breast: Secondary | ICD-10-CM | POA: Diagnosis not present

## 2016-04-20 DIAGNOSIS — Z791 Long term (current) use of non-steroidal anti-inflammatories (NSAID): Secondary | ICD-10-CM | POA: Diagnosis not present

## 2016-04-20 DIAGNOSIS — M25742 Osteophyte, left hand: Secondary | ICD-10-CM | POA: Diagnosis present

## 2016-04-20 DIAGNOSIS — E785 Hyperlipidemia, unspecified: Secondary | ICD-10-CM | POA: Insufficient documentation

## 2016-04-20 HISTORY — PX: MASS EXCISION: SHX2000

## 2016-04-20 SURGERY — EXCISION MASS
Anesthesia: General | Site: Finger | Laterality: Left

## 2016-04-20 MED ORDER — CYCLOBENZAPRINE HCL 10 MG PO TABS: 10.0000 mg | ORAL_TABLET | Freq: Three times a day (TID) | ORAL | 0 refills | Status: DC | PRN

## 2016-04-20 MED ORDER — LACTATED RINGERS IV SOLN
INTRAVENOUS | Status: DC
  Administered 2016-04-20: 14:00:00 via INTRAVENOUS

## 2016-04-20 MED ORDER — FENTANYL CITRATE (PF) 100 MCG/2ML IJ SOLN
INTRAMUSCULAR | Status: AC
  Filled 2016-04-20: qty 2

## 2016-04-20 MED ORDER — DEXAMETHASONE SODIUM PHOSPHATE 10 MG/ML IJ SOLN
INTRAMUSCULAR | Status: AC
  Filled 2016-04-20: qty 1

## 2016-04-20 MED ORDER — PROPOFOL 10 MG/ML IV BOLUS
INTRAVENOUS | Status: AC
  Filled 2016-04-20: qty 20

## 2016-04-20 MED ORDER — HYDROMORPHONE HCL 1 MG/ML IJ SOLN: 0.2500 mg | INTRAMUSCULAR | Status: DC | PRN

## 2016-04-20 MED ORDER — MEPERIDINE HCL 25 MG/ML IJ SOLN: 6.2500 mg | INTRAMUSCULAR | Status: DC | PRN

## 2016-04-20 MED ORDER — CHLORHEXIDINE GLUCONATE 4 % EX LIQD: 60.0000 mL | Freq: Once | CUTANEOUS | Status: DC

## 2016-04-20 MED ORDER — CEFAZOLIN SODIUM-DEXTROSE 2-4 GM/100ML-% IV SOLN
2.0000 g | INTRAVENOUS | Status: AC
  Administered 2016-04-20: 2 g via INTRAVENOUS

## 2016-04-20 MED ORDER — SCOPOLAMINE 1 MG/3DAYS TD PT72: 1.0000 | MEDICATED_PATCH | Freq: Once | TRANSDERMAL | Status: DC | PRN

## 2016-04-20 MED ORDER — BUPIVACAINE HCL (PF) 0.25 % IJ SOLN
INTRAMUSCULAR | Status: DC | PRN
  Administered 2016-04-20: 10 mL

## 2016-04-20 MED ORDER — MIDAZOLAM HCL 2 MG/2ML IJ SOLN
INTRAMUSCULAR | Status: AC
  Filled 2016-04-20: qty 2

## 2016-04-20 MED ORDER — LIDOCAINE HCL (CARDIAC) 20 MG/ML IV SOLN
INTRAVENOUS | Status: DC | PRN
  Administered 2016-04-20: 50 mg via INTRAVENOUS

## 2016-04-20 MED ORDER — ONDANSETRON HCL 4 MG/2ML IJ SOLN
INTRAMUSCULAR | Status: DC | PRN
  Administered 2016-04-20: 4 mg via INTRAVENOUS

## 2016-04-20 MED ORDER — PROPOFOL 10 MG/ML IV BOLUS
INTRAVENOUS | Status: DC | PRN
  Administered 2016-04-20: 200 mg via INTRAVENOUS

## 2016-04-20 MED ORDER — ONDANSETRON HCL 4 MG/2ML IJ SOLN
INTRAMUSCULAR | Status: AC
  Filled 2016-04-20: qty 2

## 2016-04-20 MED ORDER — MIDAZOLAM HCL 2 MG/2ML IJ SOLN
1.0000 mg | INTRAMUSCULAR | Status: DC | PRN
  Administered 2016-04-20: 2 mg via INTRAVENOUS

## 2016-04-20 MED ORDER — DEXAMETHASONE SODIUM PHOSPHATE 10 MG/ML IJ SOLN
INTRAMUSCULAR | Status: DC | PRN
  Administered 2016-04-20: 5 mg via INTRAVENOUS

## 2016-04-20 MED ORDER — CEFAZOLIN SODIUM-DEXTROSE 2-4 GM/100ML-% IV SOLN
INTRAVENOUS | Status: AC
  Filled 2016-04-20: qty 100

## 2016-04-20 MED ORDER — FENTANYL CITRATE (PF) 100 MCG/2ML IJ SOLN
50.0000 ug | INTRAMUSCULAR | Status: DC | PRN
  Administered 2016-04-20 (×2): 50 ug via INTRAVENOUS

## 2016-04-20 MED ORDER — ONDANSETRON HCL 4 MG/2ML IJ SOLN: 4.0000 mg | Freq: Once | INTRAMUSCULAR | Status: DC | PRN

## 2016-04-20 MED ORDER — HYDROCODONE-ACETAMINOPHEN 5-325 MG PO TABS: ORAL_TABLET | ORAL | 0 refills | Status: DC

## 2016-04-20 MED ORDER — LIDOCAINE 2% (20 MG/ML) 5 ML SYRINGE
INTRAMUSCULAR | Status: AC
  Filled 2016-04-20: qty 5

## 2016-04-20 SURGICAL SUPPLY — 54 items
BANDAGE ACE 3X5.8 VEL STRL LF (GAUZE/BANDAGES/DRESSINGS) IMPLANT
BANDAGE COBAN STERILE 2 (GAUZE/BANDAGES/DRESSINGS) IMPLANT
BENZOIN TINCTURE PRP APPL 2/3 (GAUZE/BANDAGES/DRESSINGS) IMPLANT
BLADE MINI RND TIP GREEN BEAV (BLADE) IMPLANT
BLADE SURG 15 STRL LF DISP TIS (BLADE) ×2 IMPLANT
BLADE SURG 15 STRL SS (BLADE) ×4
BNDG CMPR 9X4 STRL LF SNTH (GAUZE/BANDAGES/DRESSINGS) ×1
BNDG COHESIVE 1X5 TAN STRL LF (GAUZE/BANDAGES/DRESSINGS) ×3 IMPLANT
BNDG CONFORM 2 STRL LF (GAUZE/BANDAGES/DRESSINGS) IMPLANT
BNDG ELASTIC 2X5.8 VLCR STR LF (GAUZE/BANDAGES/DRESSINGS) IMPLANT
BNDG ESMARK 4X9 LF (GAUZE/BANDAGES/DRESSINGS) ×3 IMPLANT
BNDG GAUZE 1X2.1 STRL (MISCELLANEOUS) IMPLANT
BNDG GAUZE ELAST 4 BULKY (GAUZE/BANDAGES/DRESSINGS) IMPLANT
BNDG PLASTER X FAST 3X3 WHT LF (CAST SUPPLIES) IMPLANT
CHLORAPREP W/TINT 26ML (MISCELLANEOUS) ×3 IMPLANT
CLOSURE WOUND 1/2 X4 (GAUZE/BANDAGES/DRESSINGS)
CORDS BIPOLAR (ELECTRODE) ×3 IMPLANT
COVER BACK TABLE 60X90IN (DRAPES) ×3 IMPLANT
COVER MAYO STAND STRL (DRAPES) ×3 IMPLANT
CUFF TOURNIQUET SINGLE 18IN (TOURNIQUET CUFF) ×3 IMPLANT
DRAPE EXTREMITY T 121X128X90 (DRAPE) ×3 IMPLANT
DRAPE SURG 17X23 STRL (DRAPES) ×3 IMPLANT
GAUZE SPONGE 4X4 12PLY STRL (GAUZE/BANDAGES/DRESSINGS) ×3 IMPLANT
GAUZE XEROFORM 1X8 LF (GAUZE/BANDAGES/DRESSINGS) ×3 IMPLANT
GLOVE BIO SURGEON STRL SZ7.5 (GLOVE) ×3 IMPLANT
GLOVE BIOGEL PI IND STRL 7.0 (GLOVE) ×2 IMPLANT
GLOVE BIOGEL PI IND STRL 8 (GLOVE) ×1 IMPLANT
GLOVE BIOGEL PI INDICATOR 7.0 (GLOVE) ×4
GLOVE BIOGEL PI INDICATOR 8 (GLOVE) ×2
GLOVE ECLIPSE 6.5 STRL STRAW (GLOVE) ×3 IMPLANT
GOWN STRL REUS W/ TWL LRG LVL3 (GOWN DISPOSABLE) ×1 IMPLANT
GOWN STRL REUS W/TWL LRG LVL3 (GOWN DISPOSABLE) ×2
GOWN STRL REUS W/TWL XL LVL3 (GOWN DISPOSABLE) ×3 IMPLANT
NEEDLE HYPO 25X1 1.5 SAFETY (NEEDLE) ×3 IMPLANT
NS IRRIG 1000ML POUR BTL (IV SOLUTION) ×3 IMPLANT
PACK BASIN DAY SURGERY FS (CUSTOM PROCEDURE TRAY) ×3 IMPLANT
PAD CAST 3X4 CTTN HI CHSV (CAST SUPPLIES) IMPLANT
PAD CAST 4YDX4 CTTN HI CHSV (CAST SUPPLIES) IMPLANT
PADDING CAST ABS 4INX4YD NS (CAST SUPPLIES)
PADDING CAST ABS COTTON 4X4 ST (CAST SUPPLIES) IMPLANT
PADDING CAST COTTON 3X4 STRL (CAST SUPPLIES)
PADDING CAST COTTON 4X4 STRL (CAST SUPPLIES)
SPLINT FINGER 3.25 911903 (SOFTGOODS) ×3 IMPLANT
STOCKINETTE 4X48 STRL (DRAPES) ×3 IMPLANT
STRIP CLOSURE SKIN 1/2X4 (GAUZE/BANDAGES/DRESSINGS) IMPLANT
SUT ETHILON 3 0 PS 1 (SUTURE) IMPLANT
SUT ETHILON 4 0 PS 2 18 (SUTURE) ×3 IMPLANT
SUT ETHILON 5 0 P 3 18 (SUTURE)
SUT NYLON ETHILON 5-0 P-3 1X18 (SUTURE) IMPLANT
SUT VIC AB 4-0 P2 18 (SUTURE) IMPLANT
SYR BULB 3OZ (MISCELLANEOUS) ×3 IMPLANT
SYR CONTROL 10ML LL (SYRINGE) ×3 IMPLANT
TOWEL OR 17X24 6PK STRL BLUE (TOWEL DISPOSABLE) ×6 IMPLANT
UNDERPAD 30X30 (UNDERPADS AND DIAPERS) ×3 IMPLANT

## 2016-04-20 NOTE — Anesthesia Procedure Notes (Signed)
Procedure Name: LMA Insertion Date/Time: 04/20/2016 2:39 PM Performed by: Bufford Spikes Pre-anesthesia Checklist: Patient identified, Emergency Drugs available, Suction available and Patient being monitored Patient Re-evaluated:Patient Re-evaluated prior to inductionOxygen Delivery Method: Circle system utilized Preoxygenation: Pre-oxygenation with 100% oxygen Intubation Type: IV induction Ventilation: Mask ventilation without difficulty LMA: LMA inserted LMA Size: 4.0 Number of attempts: 1 Airway Equipment and Method: Bite block Placement Confirmation: positive ETCO2 Tube secured with: Tape Dental Injury: Teeth and Oropharynx as per pre-operative assessment

## 2016-04-20 NOTE — Anesthesia Preprocedure Evaluation (Signed)
Anesthesia Evaluation  Patient identified by MRN, date of birth, ID band Patient awake    Reviewed: Allergy & Precautions, NPO status , Patient's Chart, lab work & pertinent test results  Airway Mallampati: I  TM Distance: >3 FB Neck ROM: Full    Dental   Pulmonary former smoker,    Pulmonary exam normal        Cardiovascular hypertension, Pt. on medications Normal cardiovascular exam     Neuro/Psych    GI/Hepatic   Endo/Other    Renal/GU      Musculoskeletal   Abdominal   Peds  Hematology   Anesthesia Other Findings   Reproductive/Obstetrics                            Anesthesia Physical Anesthesia Plan  ASA: II  Anesthesia Plan: General   Post-op Pain Management:    Induction: Intravenous  Airway Management Planned: LMA  Additional Equipment:   Intra-op Plan:   Post-operative Plan: Extubation in OR  Informed Consent: I have reviewed the patients History and Physical, chart, labs and discussed the procedure including the risks, benefits and alternatives for the proposed anesthesia with the patient or authorized representative who has indicated his/her understanding and acceptance.     Plan Discussed with: CRNA and Surgeon  Anesthesia Plan Comments:        Anesthesia Quick Evaluation  

## 2016-04-20 NOTE — Discharge Instructions (Addendum)

## 2016-04-20 NOTE — H&P (Signed)
Desiree Oneal is an 65 y.o. female.   Chief Complaint: left long finger dip joint osteophyte HPI: 65 yo female with prominent nodule at ulnar side of left long finger dip joint.  It is bothersome to her and she wishes to have it removed.  Allergies: No Known Allergies  Past Medical History:  Diagnosis Date  . Abnormal mammogram, unspecified   . Hyperlipidemia   . Lump or mass in breast 2013  . Obesity, unspecified   . Personal history of tobacco use, presenting hazards to health   . Special screening for malignant neoplasms, colon   . Trigeminal neuralgia of left side of face 2000  . Urinary retention 2011    Past Surgical History:  Procedure Laterality Date  . ABDOMINAL HYSTERECTOMY    . BRAIN SURGERY  2000   Trigeminal neuralgia  . BREAST BIOPSY Right 1980   benign  . BREAST BIOPSY Left 1990   benign  . CARPAL TUNNEL RELEASE Right 01/22/2016   Procedure: RIGHT CARPAL TUNNEL RELEASE;  Surgeon: Leanora Cover, MD;  Location: [place];  Service: Orthopedics;  Laterality: Right;  . COLONOSCOPY  2008   Dr. Vira Agar  . HAND SURGERY Left 2010  . HAND SURGERY Right 2011  . KNEE SURGERY Right 1998    Family History: Family History  Problem Relation Age of Onset  . Breast cancer Maternal Grandmother   . Blindness Mother   . Breast cancer Cousin     maternal    Social History:   reports that she has quit smoking. She has a 20.00 pack-year smoking history. She has never used smokeless tobacco. She reports that she does not drink alcohol or use drugs.  Medications: Medications Prior to Admission  Medication Sig Dispense Refill  . acyclovir (ZOVIRAX) 200 MG capsule Take by mouth.    . carbamazepine (TEGRETOL) 200 MG tablet Take by mouth.    . cyclobenzaprine (FLEXERIL) 10 MG tablet TAKE ONE TABLET BY MOUTH AT BEDTIME 30 tablet 0  . meloxicam (MOBIC) 7.5 MG tablet Take 7.5 mg by mouth daily.    Marland Kitchen oxybutynin (DITROPAN-XL) 10 MG 24 hr tablet Take 10 mg by mouth  at bedtime.     . pravastatin (PRAVACHOL) 40 MG tablet Take by mouth.    . zolpidem (AMBIEN) 10 MG tablet Take by mouth.    . valACYclovir (VALTREX) 500 MG tablet Take by mouth.      No results found for this or any previous visit (from the past 48 hour(s)).  No results found.   A comprehensive review of systems was negative.  Blood pressure (!) 142/73, pulse 75, temperature 97.8 F (36.6 C), temperature source Oral, resp. rate 16, height 5\' 4"  (1.626 m), weight 97.2 kg (214 lb 6 oz), SpO2 100 %.  General appearance: alert, cooperative and appears stated age Head: Normocephalic, without obvious abnormality, atraumatic Neck: supple, symmetrical, trachea midline Resp: clear to auscultation bilaterally Cardio: regular rate and rhythm GI: non-tender Extremities: Intact sensation and capillary refill all digits.  +epl/fpl/io.  No wounds.  Pulses: 2+ and symmetric Skin: Skin color, texture, turgor normal. No rashes or lesions Neurologic: Grossly normal Incision/Wound:none  Assessment/Plan Left long finger prominent osteophyte.  Non operative and operative treatment options were discussed with the patient and patient wishes to proceed with operative treatment. Risks, benefits, and alternatives of surgery were discussed and the patient agrees with the plan of care.  Small superficial scratch on dorsum of finger.  Will cover with op site  for procedure.  Zedekiah Hinderman R 04/20/2016, 1:47 PM

## 2016-04-20 NOTE — Transfer of Care (Signed)
Immediate Anesthesia Transfer of Care Note  Patient: Desiree Oneal  Procedure(s) Performed: Procedure(s) with comments: EXCISION OSTEOPHYTE LEFT LONG FINGER DEBRIDEMENT DISTAL INTERPHALANGEAL JOINT (Left) - EXCISION OSTEOPHYTE LEFT LONG FINGER DEBRIDEMENT DISTAL INTERPHALANGEAL JOINT  Patient Location: PACU  Anesthesia Type:General  Level of Consciousness: sedated  Airway & Oxygen Therapy: Patient Spontanous Breathing and Patient connected to face mask oxygen  Post-op Assessment: Report given to RN and Post -op Vital signs reviewed and stable  Post vital signs: Reviewed and stable  Last Vitals:  Vitals:   04/20/16 1521 04/20/16 1522  BP:    Pulse: 84 82  Resp:  11  Temp:      Last Pain:  Vitals:   04/20/16 1323  TempSrc: Oral         Complications: No apparent anesthesia complications

## 2016-04-20 NOTE — Brief Op Note (Signed)
04/20/2016  3:13 PM  PATIENT:  Delano Metz  65 y.o. female  PRE-OPERATIVE DIAGNOSIS:  LEFT LONG FINGER OSTEOPHYTE  POST-OPERATIVE DIAGNOSIS:  LEFT LONG FINGER OSTEOPHYTE  PROCEDURE:  Procedure(s) with comments: EXCISION OSTEOPHYTE LEFT LONG FINGER DEBRIDEMENT DISTAL INTERPHALANGEAL JOINT (Left) - EXCISION OSTEOPHYTE LEFT LONG FINGER DEBRIDEMENT DISTAL INTERPHALANGEAL JOINT  SURGEON:  Surgeon(s) and Role:    * Leanora Cover, MD - Primary  PHYSICIAN ASSISTANT:   ASSISTANTS: none   ANESTHESIA:   general  EBL:  Total I/O In: 500 [I.V.:500] Out: -   BLOOD ADMINISTERED:none  DRAINS: none   LOCAL MEDICATIONS USED:  MARCAINE     SPECIMEN:  No Specimen  DISPOSITION OF SPECIMEN:  N/A  COUNTS:  YES  TOURNIQUET:   Total Tourniquet Time Documented: Upper Arm (Left) - 22 minutes Total: Upper Arm (Left) - 22 minutes   DICTATION: .Other Dictation: Dictation Number [id number]  PLAN OF CARE: Discharge to home after PACU  PATIENT DISPOSITION:  PACU - hemodynamically stable.

## 2016-04-20 NOTE — Anesthesia Postprocedure Evaluation (Signed)
Anesthesia Post Note  Patient: Desiree Oneal  Procedure(s) Performed: Procedure(s) (LRB): EXCISION OSTEOPHYTE LEFT LONG FINGER DEBRIDEMENT DISTAL INTERPHALANGEAL JOINT (Left)  Patient location during evaluation: PACU Anesthesia Type: General Level of consciousness: awake and alert Pain management: pain level controlled Vital Signs Assessment: post-procedure vital signs reviewed and stable Respiratory status: spontaneous breathing, nonlabored ventilation, respiratory function stable and patient connected to nasal cannula oxygen Cardiovascular status: blood pressure returned to baseline and stable Postop Assessment: no signs of nausea or vomiting Anesthetic complications: no    Last Vitals:  Vitals:   04/20/16 1522 04/20/16 1610  BP:  (!) 150/78  Pulse: 82 70  Resp: 11 18  Temp:  36.4 C    Last Pain:  Vitals:   04/20/16 1610  TempSrc:   PainSc: 0-No pain                 [name] DAVID

## 2016-04-20 NOTE — Op Note (Signed)
[id number] 

## 2016-04-21 ENCOUNTER — Encounter (HOSPITAL_BASED_OUTPATIENT_CLINIC_OR_DEPARTMENT_OTHER): Payer: Self-pay | Admitting: Orthopedic Surgery

## 2016-04-21 NOTE — Op Note (Signed)
NAME:  Desiree Oneal, Desiree Oneal NO.:  [id number]  MEDICAL RECORD NO.:  [id number]  LOCATION:                                 FACILITY:  PHYSICIAN:  Leanora Cover, MD        DATE OF BIRTH:  [birth year]  DATE OF PROCEDURE:  04/20/2016 DATE OF DISCHARGE:                              OPERATIVE REPORT   PREOPERATIVE DIAGNOSIS:  Left long finger osteophytes.  POSTOPERATIVE DIAGNOSIS:  Left long finger osteophytes.  PROCEDURE:  Excision of osteophytes left long finger including distal aspect of middle phalanx and proximal aspect of distal phalanx with 2 loose osteophytes.  SURGEON:  Leanora Cover, M.D.  ASSISTANTS:  None.  ANESTHESIA:  General.  IV FLUIDS:  Per Anesthesia flow sheet.  ESTIMATED BLOOD LOSS:  Minimal.  COMPLICATIONS:  None.  SPECIMENS:  None.  TOURNIQUET TIME:  22 minutes.  DISPOSITION:  Stable to PACU.  INDICATIONS:  Desiree Oneal is a 65 year old female, who has noted a prominence of bone at the dorsal aspect of the DIP joint of the left long finger.  This is bothersome to her.  She wished to have surgery for removal of osteophytes.  Risks, benefits, and alternatives of surgery were discussed including the risk of blood loss; infection; damage to nerves, vessels, tendons, ligaments, bone; failure of surgery; need for additional surgery; complications with wound healing; continued pain; recurrence of osteophyte.  She voiced understanding of these risks and elected to proceed.  OPERATIVE COURSE:  After being identified preoperatively by myself, the patient and I agreed upon procedure and site of procedure.  Surgical site was marked.  The risks, benefits, and alternatives of surgery were reviewed and she wished to proceed.  Surgical consent had been signed. She was given IV Ancef as preoperative antibiotic prophylaxis.  She was transported to the operating room and placed on the operating room table in supine position with the left upper extremity on  arm board.  General anesthesia was induced by Anesthesiology.  Left upper extremity was prepped and draped in normal sterile orthopedic fashion.  A surgical pause was performed between surgeons, anesthesia, and operating room staff and all were in agreement as to the patient, procedure, and site of procedure.  A hockey-stick shaped incision was made at the dorsal ulnar aspect of the DIP joint of the long finger.  This was later extended distally for better visualization.  A scratch on the finger had been covered with an Opsite.  The wound was carried in subcutaneous tissues by spreading technique.  Two round osteophytes were found at the dorsal ulnar aspect of the DIP joint within the soft tissues.  These were removed.  There was prominence of the dorsal ulnar aspect of the middle phalanx as well as large osteophyte from the distal phalanx. These were taken down with a rongeur.  Skin was placed back over the wound and the area palpated to ensure that all prominence had been taken down which was the case.  The wound was copiously irrigated with sterile saline.  The extensor tendon had been protected.  The wound was then closed with 4-0 nylon in a horizontal mattress fashion.  A digital block had been  performed at the beginning of the case with 10 mL of 0.25% plain Marcaine to aid in postoperative analgesia.  The wound was dressed with sterile Xeroform, 4x4s, and wrapped with a Coban dressing lightly. Alumafoam splint was placed and wrapped lightly with Coban dressing. Tourniquet was deflated at 22 minutes.  Fingertips were pink with brisk capillary refill after deflation of the tourniquet.  Operative drapes were broken down and the patient was awoken from anesthesia safely.  She was transferred back to stretcher and taken to PACU in stable condition. I will see her back in the office in 1 week for postoperative followup. I will give her Norco 5/325 one to two p.o. q.6 hours p.r.n.  pain, dispensed #20 and Flexeril 10 mg p.o. at bedtime p.r.n. spasms, dispensed #20.     Leanora Cover, MD     KK/MEDQ  D:  04/20/2016  T:  04/21/2016  Job:  [id number]

## 2016-05-06 ENCOUNTER — Other Ambulatory Visit: Payer: Self-pay | Admitting: Orthopedic Surgery

## 2016-05-06 DIAGNOSIS — M25562 Pain in left knee: Secondary | ICD-10-CM

## 2016-05-20 ENCOUNTER — Ambulatory Visit: Payer: BC Managed Care – PPO

## 2016-08-23 ENCOUNTER — Emergency Department: Payer: BC Managed Care – PPO

## 2016-08-23 ENCOUNTER — Emergency Department
Admission: EM | Admit: 2016-08-23 | Discharge: 2016-08-24 | Disposition: A | Discharge status: Home / Self Care | Payer: BC Managed Care – PPO | Attending: Emergency Medicine | Admitting: Emergency Medicine

## 2016-08-23 ENCOUNTER — Encounter: Payer: Self-pay | Admitting: *Deleted

## 2016-08-23 DIAGNOSIS — Z79899 Other long term (current) drug therapy: Secondary | ICD-10-CM | POA: Insufficient documentation

## 2016-08-23 DIAGNOSIS — Z87891 Personal history of nicotine dependence: Secondary | ICD-10-CM | POA: Insufficient documentation

## 2016-08-23 DIAGNOSIS — B269 Mumps without complication: Secondary | ICD-10-CM

## 2016-08-23 DIAGNOSIS — R202 Paresthesia of skin: Secondary | ICD-10-CM | POA: Diagnosis present

## 2016-08-23 LAB — CBC
HEMATOCRIT: 42.1 % (ref 35.0–47.0)
Hemoglobin: 13.9 g/dL (ref 12.0–16.0)
MCH: 29.3 pg (ref 26.0–34.0)
MCHC: 32.9 g/dL (ref 32.0–36.0)
MCV: 89.1 fL (ref 80.0–100.0)
Platelets: 278 10*3/uL (ref 150–440)
RBC: 4.73 MIL/uL (ref 3.80–5.20)
RDW: 14.1 % (ref 11.5–14.5)
WBC: 10.1 10*3/uL (ref 3.6–11.0)

## 2016-08-23 LAB — BASIC METABOLIC PANEL
ANION GAP: 8 (ref 5–15)
BUN: 11 mg/dL (ref 6–20)
CALCIUM: 9.1 mg/dL (ref 8.9–10.3)
CO2: 25 mmol/L (ref 22–32)
Chloride: 106 mmol/L (ref 101–111)
Creatinine, Ser: 0.85 mg/dL (ref 0.44–1.00)
Glucose, Bld: 143 mg/dL — ABNORMAL HIGH (ref 65–99)
POTASSIUM: 3.6 mmol/L (ref 3.5–5.1)
Sodium: 139 mmol/L (ref 135–145)

## 2016-08-23 NOTE — ED Notes (Signed)
Pt ambulatory to room, talking in complete sentences.

## 2016-08-23 NOTE — ED Triage Notes (Signed)
Pt has swelling to right side of neck and face.  Sx for 2 hours.  Pt reports pain/tightness in throat.  No chest pain or sob.

## 2016-08-24 ENCOUNTER — Encounter: Payer: Self-pay | Admitting: Radiology

## 2016-08-24 MED ORDER — IOPAMIDOL (ISOVUE-300) INJECTION 61%
75.0000 mL | Freq: Once | INTRAVENOUS | Status: AC | PRN
  Administered 2016-08-24: 75 mL via INTRAVENOUS

## 2016-08-24 NOTE — ED Provider Notes (Signed)
[facility] Emergency Department Provider Note   First MD Initiated Contact with Patient 08/23/16 2308     (approximate)  I have reviewed the triage vital signs and the nursing notes.   HISTORY  Chief Complaint Facial Swelling   HPI Desiree Oneal is a 66 y.o. female presents with right-sided neck and facial swelling patient also admits to tightness in the submandibular area. Patient denies any difficulty breathing or swallowing. Patient states that symptoms started approximately 2 hours ago. Patient does however admit to tingling discomfort in the area of the aforementioned area tonight. Patient does admit to being vaccinated child against mumps.   Past Medical History:  Diagnosis Date  . Abnormal mammogram, unspecified   . Hyperlipidemia   . Lump or mass in breast 2013  . Obesity, unspecified   . Personal history of tobacco use, presenting hazards to health   . Special screening for malignant neoplasms, colon   . Trigeminal neuralgia of left side of face 2000  . Urinary retention 2011    Patient Active Problem List   Diagnosis Date Noted  . Arthritis 07/11/2015  . HLD (hyperlipidemia) 07/11/2015  . BP (high blood pressure) 07/11/2015  . Biceps tendinitis 05/30/2015  . Family history of breast cancer 04/11/2013    Past Surgical History:  Procedure Laterality Date  . ABDOMINAL HYSTERECTOMY    . BRAIN SURGERY  2000   Trigeminal neuralgia  . BREAST BIOPSY Right 1980   benign  . BREAST BIOPSY Left 1990   benign  . CARPAL TUNNEL RELEASE Right 01/22/2016   Procedure: RIGHT CARPAL TUNNEL RELEASE;  Surgeon: Leanora Cover, MD;  Location: [place];  Service: Orthopedics;  Laterality: Right;  . COLONOSCOPY  2008   Dr. Vira Agar  . HAND SURGERY Left 2010  . HAND SURGERY Right 2011  . KNEE SURGERY Right 1998  . MASS EXCISION Left 04/20/2016   Procedure: EXCISION OSTEOPHYTE LEFT LONG FINGER DEBRIDEMENT DISTAL INTERPHALANGEAL JOINT;   Surgeon: Leanora Cover, MD;  Location: [place];  Service: Orthopedics;  Laterality: Left;  EXCISION OSTEOPHYTE LEFT LONG FINGER DEBRIDEMENT DISTAL INTERPHALANGEAL JOINT    Prior to Admission medications   Medication Sig Start Date End Date Taking? Authorizing Provider  acyclovir (ZOVIRAX) 200 MG capsule Take 200 mg by mouth 2 (two) times daily.    Yes Historical Provider, MD  carbamazepine (TEGRETOL) 200 MG tablet Take 200 mg by mouth 3 (three) times daily.    Yes Historical Provider, MD  meloxicam (MOBIC) 15 MG tablet Take 15 mg by mouth daily.   Yes Historical Provider, MD  oxybutynin (DITROPAN) 5 MG tablet Take 5 mg by mouth 2 (two) times daily.   Yes Historical Provider, MD  pravastatin (PRAVACHOL) 40 MG tablet Take 40 mg by mouth daily.    Yes Historical Provider, MD  valACYclovir (VALTREX) 500 MG tablet Take 500 mg by mouth daily.    Yes Historical Provider, MD  zolpidem (AMBIEN) 10 MG tablet Take 10 mg by mouth at bedtime as needed for sleep.    Yes Historical Provider, MD  cyclobenzaprine (FLEXERIL) 10 MG tablet TAKE ONE TABLET BY MOUTH AT BEDTIME Patient not taking: Reported on 08/23/2016 08/19/15   [name], MD  cyclobenzaprine (FLEXERIL) 10 MG tablet Take 1 tablet (10 mg total) by mouth 3 (three) times daily as needed for muscle spasms. 04/20/16   Leanora Cover, MD  HYDROcodone-acetaminophen [facility]) 5-325 MG tablet 1-2 tabs po q6 hours prn pain 04/20/16  Leanora Cover, MD    Allergies No known drug allergies  Family History  Problem Relation Age of Onset  . Breast cancer Maternal Grandmother   . Blindness Mother   . Breast cancer Cousin     maternal    Social History Social History  Substance Use Topics  . Smoking status: Former Smoker    Packs/day: 1.00    Years: 20.00  . Smokeless tobacco: Never Used  . Alcohol use No    Review of Systems Constitutional: No fever/chills Eyes: No visual changes. ENT: No sore throat.Positive for right facial  swelling Cardiovascular: Denies chest pain. Respiratory: Denies shortness of breath. Gastrointestinal: No abdominal pain.  No nausea, no vomiting.  No diarrhea.  No constipation. Genitourinary: Negative for dysuria. Musculoskeletal: Negative for back pain. Skin: Negative for rash. Neurological: Negative for headaches, focal weakness or numbness.  10-point ROS otherwise negative.  ____________________________________________   PHYSICAL EXAM:  VITAL SIGNS: ED Triage Vitals  Enc Vitals Group     BP 08/23/16 2247 (!) 159/81     Pulse Rate 08/23/16 2247 76     Resp 08/23/16 2247 20     Temp 08/23/16 2247 97.8 F (36.6 C)     Temp Source 08/23/16 2247 Oral     SpO2 08/23/16 2247 100 %     Weight 08/23/16 2249 205 lb (93 kg)     Height 08/23/16 2249 5\' 4"  (1.626 m)     Head Circumference --      Peak Flow --      Pain Score 08/23/16 2247 7     Pain Loc --      Pain Edu? --      Excl. in [place]? --     Constitutional: Alert and oriented. Well appearing and in no acute distress. Eyes: Conjunctivae are normal. PERRL. EOMI. Head: Right parotid swelling, tenderness to touch Ears:  Healthy appearing ear canals and TMs bilaterally Nose: No congestion/rhinnorhea. Mouth/Throat: Mucous membranes are moist. Oropharynx non-erythematous. Neck: No stridor.  No meningeal signs.  Cardiovascular: Normal rate, regular rhythm. Good peripheral circulation. Grossly normal heart sounds. Respiratory: Normal respiratory effort.  No retractions. Lungs CTAB. Gastrointestinal: Soft and nontender. No distention.  Musculoskeletal: No lower extremity tenderness nor edema. No gross deformities of extremities. Neurologic:  Normal speech and language. No gross focal neurologic deficits are appreciated.  Skin:  Skin is warm, dry and intact. No rash noted. Psychiatric: Mood and affect are normal. Speech and behavior are normal.  ____________________________________________   LABS (all labs ordered are  listed, but only abnormal results are displayed)  Labs Reviewed  BASIC METABOLIC PANEL - Abnormal; Notable for the following:       Result Value   Glucose, Bld 143 (*)    All other components within normal limits  CBC  MUMPS ANTIBODY, IGG  MUMPS ANTIBODY, IGM     RADIOLOGY I, [place] BROWN, personally viewed and evaluated these images (plain radiographs) as part of my medical decision making, as well as reviewing the written report by the radiologist.  Ct Maxillofacial W Contrast  Result Date: 08/24/2016 CLINICAL DATA:  Acute onset of right-sided neck and facial swelling, with pain and tightness at the throat. Initial encounter. EXAM: CT MAXILLOFACIAL WITH CONTRAST TECHNIQUE: Multidetector CT imaging of the maxillofacial structures was performed. Multiplanar CT image reconstructions were also generated. A small metallic BB was placed on the right temple in order to reliably differentiate right from left. COMPARISON:  Paranasal sinus CT performed 07/06/2007 FINDINGS: Osseous:  There is no evidence of fracture or dislocation. The maxilla and mandible appear intact. The nasal bone is unremarkable in appearance. The visualized dentition demonstrates no acute abnormality. Orbits: The orbits are intact bilaterally. Sinuses: There is partial opacification of the maxillary sinuses bilaterally, worse on the right. The remaining visualized paranasal sinuses and mastoid air cells are well-aerated. Soft tissues: No significant soft tissue abnormalities are seen. The parapharyngeal fat planes are preserved. The nasopharynx, oropharynx and hypopharynx are unremarkable in appearance. The visualized portions of the valleculae and piriform sinuses are grossly unremarkable. The parotid and submandibular glands are within normal limits. No cervical lymphadenopathy is seen. Limited intracranial: The visualized portions of the brain are grossly unremarkable. IMPRESSION: 1. No evidence of fracture or dislocation with  regard to the maxillofacial structures. 2. Partial opacification of the maxillary sinuses bilaterally, worse on the right. Electronically Signed   By: Garald Balding M.D.   On: 08/24/2016 00:33      Procedures   ____________________________________________   INITIAL IMPRESSION / ASSESSMENT AND PLAN / ED COURSE  Pertinent labs & imaging results that were available during my care of the patient were reviewed by me and considered in my medical decision making (see chart for details).  66 year old female presenting with right parotitis concerning for possible MUMPS. As such laboratory testing for before mentioned was obtained. Patient is advised to avoid unimmunized patients as well as children for at least one week. She was also advised that a possibility of the other parotid gland becoming swollen. Patient is advised to take Tylenol or ibuprofen for fever or discomfort.      ____________________________________________  FINAL CLINICAL IMPRESSION(S) / ED DIAGNOSES  Final diagnoses:  Mumps parotitis     MEDICATIONS GIVEN DURING THIS VISIT:  Medications  iopamidol (ISOVUE-300) 61 % injection 75 mL (75 mLs Intravenous Contrast Given 08/24/16 0011)     NEW OUTPATIENT MEDICATIONS STARTED DURING THIS VISIT:  New Prescriptions   No medications on file    Modified Medications   No medications on file    Discontinued Medications   MELOXICAM (MOBIC) 7.5 MG TABLET    Take 7.5 mg by mouth daily.   OXYBUTYNIN (DITROPAN-XL) 10 MG 24 HR TABLET    Take 10 mg by mouth at bedtime.      Note:  This document was prepared using Dragon voice recognition software and may include unintentional dictation errors.    Gregor Hams, MD 08/24/16 [phone]

## 2016-08-25 LAB — MUMPS ANTIBODY, IGM: Mumps IgM: <0.8 AU (ref 0.00–0.79)

## 2016-08-25 LAB — MUMPS ANTIBODY, IGG: Mumps IgG: 113 AU/mL (ref >10.9)

## 2016-12-31 ENCOUNTER — Other Ambulatory Visit: Payer: Self-pay | Admitting: Orthopedic Surgery

## 2017-01-18 ENCOUNTER — Inpatient Hospital Stay: Admission: RE | Admit: 2017-01-18 | Payer: BC Managed Care – PPO | Source: Ambulatory Visit

## 2017-01-21 ENCOUNTER — Encounter
Admission: RE | Admit: 2017-01-21 | Discharge: 2017-01-21 | Disposition: A | Discharge status: Home / Self Care | Payer: BC Managed Care – PPO | Source: Ambulatory Visit | Attending: Orthopedic Surgery | Admitting: Orthopedic Surgery

## 2017-01-21 DIAGNOSIS — Z01818 Encounter for other preprocedural examination: Secondary | ICD-10-CM | POA: Insufficient documentation

## 2017-01-21 LAB — CBC WITH DIFFERENTIAL/PLATELET
BASOS PCT: 1 %
Basophils Absolute: 0.1 10*3/uL (ref 0–0.1)
Eosinophils Absolute: 0.1 10*3/uL (ref 0–0.7)
Eosinophils Relative: 2 %
HEMATOCRIT: 40.8 % (ref 35.0–47.0)
HEMOGLOBIN: 13.6 g/dL (ref 12.0–16.0)
LYMPHS ABS: 3 10*3/uL (ref 1.0–3.6)
Lymphocytes Relative: 48 %
MCH: 30.3 pg (ref 26.0–34.0)
MCHC: 33.3 g/dL (ref 32.0–36.0)
MCV: 91.1 fL (ref 80.0–100.0)
MONO ABS: 0.6 10*3/uL (ref 0.2–0.9)
MONOS PCT: 10 %
NEUTROS ABS: 2.4 10*3/uL (ref 1.4–6.5)
Neutrophils Relative %: 39 %
Platelets: 271 10*3/uL (ref 150–440)
RBC: 4.48 MIL/uL (ref 3.80–5.20)
RDW: 14.1 % (ref 11.5–14.5)
WBC: 6.2 10*3/uL (ref 3.6–11.0)

## 2017-01-21 LAB — BASIC METABOLIC PANEL
ANION GAP: 7 (ref 5–15)
BUN: 14 mg/dL (ref 6–20)
CALCIUM: 9.1 mg/dL (ref 8.9–10.3)
CHLORIDE: 106 mmol/L (ref 101–111)
CO2: 27 mmol/L (ref 22–32)
Creatinine, Ser: 0.94 mg/dL (ref 0.44–1.00)
GFR calc Af Amer: >60 mL/min (ref >60)
GFR calc non Af Amer: >60 mL/min (ref >60)
GLUCOSE: 78 mg/dL (ref 65–99)
Potassium: 3.9 mmol/L (ref 3.5–5.1)
Sodium: 140 mmol/L (ref 135–145)

## 2017-01-21 LAB — APTT: aPTT: 28 seconds (ref 24–36)

## 2017-01-21 LAB — PROTIME-INR
INR: 0.96
Prothrombin Time: 12.7 seconds (ref 11.4–15.2)

## 2017-01-21 NOTE — Patient Instructions (Signed)
Your procedure is scheduled on: Tuesday, January 25, 2017 Report to Same Day Surgery on the 2nd floor in the [facility]. To find out your arrival time, please call [phone] between 1PM - 3PM on: Monday, January 24, 2017  REMEMBER: Instructions that are not followed completely may result in serious medical risk up to and including death; or upon the discretion of your surgeon and anesthesiologist your surgery may need to be rescheduled.  Do not eat food or drink liquids after midnight. No gum chewing or hard candies.  You may however, drink CLEAR liquids up to 2 hours before you are scheduled to arrive at the hospital for your procedure.  Do not drink clear liquids within 2 hours of your scheduled arrival to the hospital as this may lead to your procedure being delayed or rescheduled.  Clear liquids include: - water  - apple juice without pulp - clear gatorade - black coffee or tea (NO milk, creamers, sugars) DO NOT drink anything not on this list.  Type 1 and Type 2 diabetics should only drink water.  No Alcohol for 24 hours before or after surgery.  No Smoking for 24 hours prior to surgery.  Notify your doctor if there is any change in your medical condition (cold, fever, infection).  Do not wear jewelry, make-up, hairpins, clips or nail polish.  Do not wear lotions, powders, or perfumes.   Do not shave 48 hours prior to surgery.   Contacts and dentures may not be worn into surgery.  Do not bring valuables to the hospital. [facility] is not responsible for any belongings or valuables.   TAKE THESE MEDICATIONS THE MORNING OF SURGERY WITH A SIP OF WATER: 1.  CARBAMAZEPINE (TEGRETOL) 2.  OXYBUTYNIN (DITROPAN) - if needed  Use CHG Soap or wipes as directed on instruction sheet.  Stop Anti-inflammatories such as MELOXICAM, Advil, Aleve, Ibuprofen, Motrin, Naproxen, Naprosyn, Goodie powder, or aspirin products. (May take Tylenol or Acetaminophen and Celebrex if  needed.)  Stop supplements until after surgery.  If you are being discharged the day of surgery, you will not be allowed to drive home. You will need someone to drive you home and stay with you that night.   If you are taking public transportation, you will need to have a responsible adult to with you.  Please call the number above if you have any questions about these instructions.

## 2017-01-24 MED ORDER — CEFAZOLIN SODIUM-DEXTROSE 2-4 GM/100ML-% IV SOLN
2.0000 g | INTRAVENOUS | Status: AC
  Administered 2017-01-25: 2 g via INTRAVENOUS

## 2017-01-25 ENCOUNTER — Encounter: Payer: Self-pay | Admitting: *Deleted

## 2017-01-25 ENCOUNTER — Ambulatory Visit
Admission: RE | Admit: 2017-01-25 | Discharge: 2017-01-25 | Disposition: A | Discharge status: Home / Self Care | Payer: Worker's Compensation | Source: Ambulatory Visit | Attending: Orthopedic Surgery | Admitting: Orthopedic Surgery

## 2017-01-25 ENCOUNTER — Encounter
Admission: RE | Disposition: A | Discharge status: Home / Self Care | Payer: Self-pay | Source: Ambulatory Visit | Attending: Orthopedic Surgery

## 2017-01-25 ENCOUNTER — Ambulatory Visit: Payer: Worker's Compensation | Admitting: Anesthesiology

## 2017-01-25 DIAGNOSIS — Z79899 Other long term (current) drug therapy: Secondary | ICD-10-CM | POA: Insufficient documentation

## 2017-01-25 DIAGNOSIS — M75122 Complete rotator cuff tear or rupture of left shoulder, not specified as traumatic: Secondary | ICD-10-CM | POA: Insufficient documentation

## 2017-01-25 DIAGNOSIS — Z87891 Personal history of nicotine dependence: Secondary | ICD-10-CM | POA: Diagnosis not present

## 2017-01-25 DIAGNOSIS — S46212A Strain of muscle, fascia and tendon of other parts of biceps, left arm, initial encounter: Secondary | ICD-10-CM | POA: Diagnosis not present

## 2017-01-25 DIAGNOSIS — M19012 Primary osteoarthritis, left shoulder: Secondary | ICD-10-CM | POA: Insufficient documentation

## 2017-01-25 DIAGNOSIS — S43432A Superior glenoid labrum lesion of left shoulder, initial encounter: Secondary | ICD-10-CM | POA: Insufficient documentation

## 2017-01-25 DIAGNOSIS — M25511 Pain in right shoulder: Secondary | ICD-10-CM | POA: Diagnosis not present

## 2017-01-25 DIAGNOSIS — I10 Essential (primary) hypertension: Secondary | ICD-10-CM | POA: Diagnosis not present

## 2017-01-25 DIAGNOSIS — Z791 Long term (current) use of non-steroidal anti-inflammatories (NSAID): Secondary | ICD-10-CM | POA: Diagnosis not present

## 2017-01-25 DIAGNOSIS — M545 Low back pain: Secondary | ICD-10-CM | POA: Diagnosis not present

## 2017-01-25 DIAGNOSIS — Y990 Civilian activity done for income or pay: Secondary | ICD-10-CM | POA: Diagnosis not present

## 2017-01-25 DIAGNOSIS — M25812 Other specified joint disorders, left shoulder: Secondary | ICD-10-CM | POA: Diagnosis not present

## 2017-01-25 HISTORY — PX: CHONDROPLASTY: SHX5177

## 2017-01-25 HISTORY — PX: SHOULDER ARTHROSCOPY WITH OPEN ROTATOR CUFF REPAIR: SHX6092

## 2017-01-25 SURGERY — ARTHROSCOPY, SHOULDER WITH REPAIR, ROTATOR CUFF, OPEN
Anesthesia: Regional | Site: Shoulder | Laterality: Left

## 2017-01-25 MED ORDER — FAMOTIDINE 20 MG PO TABS
ORAL_TABLET | ORAL | Status: AC
  Administered 2017-01-25: 20 mg via ORAL
  Filled 2017-01-25: qty 1

## 2017-01-25 MED ORDER — PROPOFOL 10 MG/ML IV BOLUS
INTRAVENOUS | Status: AC
  Filled 2017-01-25: qty 40

## 2017-01-25 MED ORDER — MIDAZOLAM HCL 2 MG/2ML IJ SOLN
INTRAMUSCULAR | Status: AC
  Administered 2017-01-25: 1 mg via INTRAVENOUS
  Filled 2017-01-25: qty 2

## 2017-01-25 MED ORDER — GLYCOPYRROLATE 0.2 MG/ML IJ SOLN
INTRAMUSCULAR | Status: AC
  Filled 2017-01-25: qty 3

## 2017-01-25 MED ORDER — LIDOCAINE HCL (PF) 1 % IJ SOLN
INTRAMUSCULAR | Status: DC | PRN
  Administered 2017-01-25: 5 mL via SUBCUTANEOUS

## 2017-01-25 MED ORDER — BUPIVACAINE HCL (PF) 0.25 % IJ SOLN
INTRAMUSCULAR | Status: AC
  Filled 2017-01-25: qty 30

## 2017-01-25 MED ORDER — FENTANYL CITRATE (PF) 100 MCG/2ML IJ SOLN
INTRAMUSCULAR | Status: DC | PRN
  Administered 2017-01-25 (×2): 50 ug via INTRAVENOUS

## 2017-01-25 MED ORDER — NEOSTIGMINE METHYLSULFATE 10 MG/10ML IV SOLN
INTRAVENOUS | Status: DC | PRN
  Administered 2017-01-25: 5 mg via INTRAVENOUS

## 2017-01-25 MED ORDER — NEOSTIGMINE METHYLSULFATE 10 MG/10ML IV SOLN
INTRAVENOUS | Status: AC
  Filled 2017-01-25: qty 1

## 2017-01-25 MED ORDER — LACTATED RINGERS IV SOLN
INTRAVENOUS | Status: DC
  Administered 2017-01-25 (×3): via INTRAVENOUS

## 2017-01-25 MED ORDER — PROMETHAZINE HCL 25 MG/ML IJ SOLN: 6.2500 mg | INTRAMUSCULAR | Status: DC | PRN

## 2017-01-25 MED ORDER — ROPIVACAINE HCL 5 MG/ML IJ SOLN
INTRAMUSCULAR | Status: AC
  Filled 2017-01-25: qty 30

## 2017-01-25 MED ORDER — EPHEDRINE SULFATE 50 MG/ML IJ SOLN
INTRAMUSCULAR | Status: AC
  Filled 2017-01-25: qty 1

## 2017-01-25 MED ORDER — FAMOTIDINE 20 MG PO TABS
20.0000 mg | ORAL_TABLET | Freq: Once | ORAL | Status: AC
  Administered 2017-01-25: 20 mg via ORAL

## 2017-01-25 MED ORDER — PHENYLEPHRINE HCL 10 MG/ML IJ SOLN
INTRAMUSCULAR | Status: AC
  Filled 2017-01-25: qty 1

## 2017-01-25 MED ORDER — FENTANYL CITRATE (PF) 100 MCG/2ML IJ SOLN
INTRAMUSCULAR | Status: AC
  Administered 2017-01-25: 50 ug via INTRAVENOUS
  Filled 2017-01-25: qty 2

## 2017-01-25 MED ORDER — GLYCOPYRROLATE 0.2 MG/ML IJ SOLN
INTRAMUSCULAR | Status: DC | PRN
  Administered 2017-01-25: 0.2 mg via INTRAVENOUS
  Administered 2017-01-25: 0.6 mg via INTRAVENOUS

## 2017-01-25 MED ORDER — ONDANSETRON HCL 4 MG/2ML IJ SOLN
INTRAMUSCULAR | Status: DC | PRN
  Administered 2017-01-25: 4 mg via INTRAVENOUS

## 2017-01-25 MED ORDER — EPHEDRINE SULFATE 50 MG/ML IJ SOLN
INTRAMUSCULAR | Status: DC | PRN
  Administered 2017-01-25: 5 mg via INTRAVENOUS

## 2017-01-25 MED ORDER — FENTANYL CITRATE (PF) 100 MCG/2ML IJ SOLN
INTRAMUSCULAR | Status: AC
  Filled 2017-01-25: qty 2

## 2017-01-25 MED ORDER — LABETALOL HCL 5 MG/ML IV SOLN
INTRAVENOUS | Status: AC
Start: 2017-01-25 — End: 2017-01-25
  Filled 2017-01-25: qty 4

## 2017-01-25 MED ORDER — NEOMYCIN-POLYMYXIN B GU 40-200000 IR SOLN
Status: DC | PRN
Start: 2017-01-25 — End: 2017-01-25
  Administered 2017-01-25: 2 mL

## 2017-01-25 MED ORDER — CHLORHEXIDINE GLUCONATE CLOTH 2 % EX PADS: 6.0000 | MEDICATED_PAD | Freq: Once | CUTANEOUS | Status: DC

## 2017-01-25 MED ORDER — OXYCODONE HCL 5 MG PO TABS: 5.0000 mg | ORAL_TABLET | ORAL | 0 refills | Status: DC | PRN

## 2017-01-25 MED ORDER — FENTANYL CITRATE (PF) 100 MCG/2ML IJ SOLN: 25.0000 ug | INTRAMUSCULAR | Status: DC | PRN

## 2017-01-25 MED ORDER — EPINEPHRINE 30 MG/30ML IJ SOLN
INTRAMUSCULAR | Status: AC
  Filled 2017-01-25: qty 1

## 2017-01-25 MED ORDER — ARTIFICIAL TEARS OPHTHALMIC OINT
TOPICAL_OINTMENT | OPHTHALMIC | Status: AC
  Filled 2017-01-25: qty 3.5

## 2017-01-25 MED ORDER — FENTANYL CITRATE (PF) 100 MCG/2ML IJ SOLN
50.0000 ug | Freq: Once | INTRAMUSCULAR | Status: AC
  Administered 2017-01-25: 50 ug via INTRAVENOUS

## 2017-01-25 MED ORDER — ONDANSETRON HCL 4 MG PO TABS: 4.0000 mg | ORAL_TABLET | Freq: Three times a day (TID) | ORAL | 0 refills | Status: DC | PRN

## 2017-01-25 MED ORDER — CEFAZOLIN SODIUM-DEXTROSE 2-4 GM/100ML-% IV SOLN
INTRAVENOUS | Status: AC
  Filled 2017-01-25: qty 100

## 2017-01-25 MED ORDER — DEXAMETHASONE SODIUM PHOSPHATE 10 MG/ML IJ SOLN
INTRAMUSCULAR | Status: DC | PRN
  Administered 2017-01-25: 10 mg via INTRAVENOUS

## 2017-01-25 MED ORDER — MIDAZOLAM HCL 2 MG/2ML IJ SOLN
INTRAMUSCULAR | Status: AC
  Filled 2017-01-25: qty 2

## 2017-01-25 MED ORDER — ROCURONIUM BROMIDE 50 MG/5ML IV SOLN
INTRAVENOUS | Status: AC
  Filled 2017-01-25: qty 3

## 2017-01-25 MED ORDER — LIDOCAINE HCL (PF) 1 % IJ SOLN
INTRAMUSCULAR | Status: AC
  Filled 2017-01-25: qty 30

## 2017-01-25 MED ORDER — PROPOFOL 10 MG/ML IV BOLUS
INTRAVENOUS | Status: AC
  Filled 2017-01-25: qty 20

## 2017-01-25 MED ORDER — MIDAZOLAM HCL 2 MG/2ML IJ SOLN
1.0000 mg | Freq: Once | INTRAMUSCULAR | Status: AC
  Administered 2017-01-25: 1 mg via INTRAVENOUS

## 2017-01-25 MED ORDER — PHENYLEPHRINE HCL 10 MG/ML IJ SOLN
INTRAMUSCULAR | Status: DC | PRN
  Administered 2017-01-25 (×2): 100 ug via INTRAVENOUS
  Administered 2017-01-25: 200 ug via INTRAVENOUS
  Administered 2017-01-25 (×3): 100 ug via INTRAVENOUS

## 2017-01-25 MED ORDER — BUPIVACAINE HCL 0.25 % IJ SOLN
INTRAMUSCULAR | Status: DC | PRN
  Administered 2017-01-25: 30 mL

## 2017-01-25 MED ORDER — LABETALOL HCL 5 MG/ML IV SOLN
INTRAVENOUS | Status: DC | PRN
  Administered 2017-01-25 (×3): 5 mg via INTRAVENOUS

## 2017-01-25 MED ORDER — LIDOCAINE HCL (PF) 1 % IJ SOLN
INTRAMUSCULAR | Status: AC
  Filled 2017-01-25: qty 5

## 2017-01-25 MED ORDER — SEVOFLURANE IN SOLN
RESPIRATORY_TRACT | Status: AC
  Filled 2017-01-25: qty 250

## 2017-01-25 MED ORDER — LIDOCAINE HCL 1 % IJ SOLN
INTRAMUSCULAR | Status: DC | PRN
  Administered 2017-01-25: 10 mL

## 2017-01-25 MED ORDER — LIDOCAINE HCL (CARDIAC) 20 MG/ML IV SOLN
INTRAVENOUS | Status: DC | PRN
  Administered 2017-01-25: 60 mg via INTRAVENOUS

## 2017-01-25 MED ORDER — NEOMYCIN-POLYMYXIN B GU 40-200000 IR SOLN
Status: AC
  Filled 2017-01-25: qty 2

## 2017-01-25 MED ORDER — PROPOFOL 10 MG/ML IV BOLUS
INTRAVENOUS | Status: DC | PRN
  Administered 2017-01-25: 200 mg via INTRAVENOUS

## 2017-01-25 MED ORDER — ROCURONIUM BROMIDE 100 MG/10ML IV SOLN
INTRAVENOUS | Status: DC | PRN
  Administered 2017-01-25: 10 mg via INTRAVENOUS
  Administered 2017-01-25: 20 mg via INTRAVENOUS
  Administered 2017-01-25: 10 mg via INTRAVENOUS
  Administered 2017-01-25: 30 mg via INTRAVENOUS
  Administered 2017-01-25: 10 mg via INTRAVENOUS

## 2017-01-25 MED ORDER — EPINEPHRINE PF 1 MG/ML IJ SOLN
INTRAMUSCULAR | Status: DC | PRN
  Administered 2017-01-25: 8 mL

## 2017-01-25 MED ORDER — ROPIVACAINE HCL 5 MG/ML IJ SOLN
INTRAMUSCULAR | Status: DC | PRN
  Administered 2017-01-25: 30 mL via PERINEURAL

## 2017-01-25 SURGICAL SUPPLY — 77 items
ADAPTER IRRIG TUBE 2 SPIKE SOL (ADAPTER) ×8 IMPLANT
ADPR TBG 2 SPK PMP STRL ASCP (ADAPTER) ×4
ANCH SUT Q-FX 2.8 (Anchor) ×1 IMPLANT
ANCHOR ALL-SUT Q-FIX 2.8 (Anchor) ×4 IMPLANT
BUR RADIUS 4.0X18.5 (BURR) ×4 IMPLANT
BUR RADIUS 5.5 (BURR) ×4 IMPLANT
CANISTER SUCT LVC 12 LTR MEDI- (MISCELLANEOUS) IMPLANT
CANNULA 5.75X7 CRYSTAL CLEAR (CANNULA) ×8 IMPLANT
CANNULA PARTIAL THREAD 2X7 (CANNULA) ×4 IMPLANT
CANNULA TWIST IN 8.25X9CM (CANNULA) IMPLANT
CLOSURE WOUND 1/2 X4 (GAUZE/BANDAGES/DRESSINGS) ×1
CONNECTOR PERFECT PASSER (CONNECTOR) ×4 IMPLANT
COOLER POLAR GLACIER W/PUMP (MISCELLANEOUS) ×4 IMPLANT
CRADLE LAMINECT ARM (MISCELLANEOUS) ×4 IMPLANT
DEVICE SUCT BLK HOLE OR FLOOR (MISCELLANEOUS) IMPLANT
DRAPE IMP U-DRAPE 54X76 (DRAPES) ×8 IMPLANT
DRAPE INCISE IOBAN 66X45 STRL (DRAPES) ×4 IMPLANT
DRAPE SHEET LG 3/4 BI-LAMINATE (DRAPES) ×4 IMPLANT
DRAPE U-SHAPE 47X51 STRL (DRAPES) IMPLANT
DURAPREP 26ML APPLICATOR (WOUND CARE) ×12 IMPLANT
ELECT REM PT RETURN 9FT ADLT (ELECTROSURGICAL) ×4
ELECTRODE REM PT RTRN 9FT ADLT (ELECTROSURGICAL) ×2 IMPLANT
GAUZE PETRO XEROFOAM 1X8 (MISCELLANEOUS) ×4 IMPLANT
GAUZE SPONGE 4X4 12PLY STRL (GAUZE/BANDAGES/DRESSINGS) ×4 IMPLANT
GAUZE XEROFORM 4X4 STRL (GAUZE/BANDAGES/DRESSINGS) ×4 IMPLANT
GLOVE BIOGEL PI IND STRL 7.0 (GLOVE) ×2 IMPLANT
GLOVE BIOGEL PI IND STRL 9 (GLOVE) ×2 IMPLANT
GLOVE BIOGEL PI INDICATOR 7.0 (GLOVE) ×2
GLOVE BIOGEL PI INDICATOR 9 (GLOVE) ×2
GLOVE SURG 9.0 ORTHO LTXF (GLOVE) ×8 IMPLANT
GOWN STRL REUS TWL 2XL XL LVL4 (GOWN DISPOSABLE) ×8 IMPLANT
GOWN STRL REUS W/ TWL LRG LVL3 (GOWN DISPOSABLE) ×2 IMPLANT
GOWN STRL REUS W/ TWL LRG LVL4 (GOWN DISPOSABLE) ×2 IMPLANT
GOWN STRL REUS W/TWL LRG LVL3 (GOWN DISPOSABLE) ×2
GOWN STRL REUS W/TWL LRG LVL4 (GOWN DISPOSABLE) ×4
IV LACTATED RINGER IRRG 3000ML (IV SOLUTION) ×16
IV LR IRRIG 3000ML ARTHROMATIC (IV SOLUTION) ×16 IMPLANT
KIT RM TURNOVER STRD PROC AR (KITS) ×4 IMPLANT
KIT STABILIZATION SHOULDER (MISCELLANEOUS) ×4 IMPLANT
KIT SUTURE 2.8 Q-FIX DISP (MISCELLANEOUS) ×4 IMPLANT
KIT SUTURETAK 3.0 INSERT PERC (KITS) IMPLANT
MANIFOLD NEPTUNE II (INSTRUMENTS) ×4 IMPLANT
MASK FACE SPIDER DISP (MASK) ×4 IMPLANT
MAT BLUE FLOOR 46X72 FLO (MISCELLANEOUS) ×8 IMPLANT
NDL SAFETY 18GX1.5 (NEEDLE) ×4 IMPLANT
NDL SAFETY 22GX1.5 (NEEDLE) ×4 IMPLANT
NS IRRIG 500ML POUR BTL (IV SOLUTION) ×4 IMPLANT
PACK ARTHROSCOPY SHOULDER (MISCELLANEOUS) ×4 IMPLANT
PAD ABD DERMACEA PRESS 5X9 (GAUZE/BANDAGES/DRESSINGS) ×4 IMPLANT
PAD WRAPON POLAR SHDR XLG (MISCELLANEOUS) ×2 IMPLANT
PASSER SUT CAPTURE FIRST (SUTURE) ×4 IMPLANT
SET TUBE SUCT SHAVER OUTFL 24K (TUBING) ×4 IMPLANT
SET TUBE TIP INTRA-ARTICULAR (MISCELLANEOUS) ×4 IMPLANT
SLEEVE PROTECTION STRL DISP (MISCELLANEOUS) ×4 IMPLANT
STRAP SAFETY BODY (MISCELLANEOUS) ×4 IMPLANT
STRIP CLOSURE SKIN 1/2X4 (GAUZE/BANDAGES/DRESSINGS) ×3 IMPLANT
SUT ETHILON 4-0 (SUTURE) ×3
SUT ETHILON 4-0 FS2 18XMFL BLK (SUTURE) ×2
SUT KNTLS 2.8 MAGNUM (Anchor) ×12 IMPLANT
SUT LASSO 90 DEG SD STR (SUTURE) IMPLANT
SUT MNCRL 4-0 (SUTURE) ×3
SUT MNCRL 4-0 27XMFL (SUTURE) ×2
SUT PDS AB 0 CT1 27 (SUTURE) ×4 IMPLANT
SUT PERFECTPASSER WHITE CART (SUTURE) ×12 IMPLANT
SUT SMART STITCH CARTRIDGE (SUTURE) ×4 IMPLANT
SUT VIC AB 0 CT1 36 (SUTURE) ×4 IMPLANT
SUT VIC AB 2-0 CT2 27 (SUTURE) ×4 IMPLANT
SUTURE ETHLN 4-0 FS2 18XMF BLK (SUTURE) ×2 IMPLANT
SUTURE MAGNUM WIRE 2X48 BLK (SUTURE) IMPLANT
SUTURE MNCRL 4-0 27XMF (SUTURE) ×2 IMPLANT
SYRINGE 10CC LL (SYRINGE) ×4 IMPLANT
TAPE MICROFOAM 4IN (TAPE) ×4 IMPLANT
TUBING ARTHRO INFLOW-ONLY STRL (TUBING) ×4 IMPLANT
TUBING CONNECTING 10 (TUBING) ×3 IMPLANT
TUBING CONNECTING 10' (TUBING) ×1
WAND HAND CNTRL MULTIVAC 90 (MISCELLANEOUS) ×4 IMPLANT
WRAPON POLAR PAD SHDR XLG (MISCELLANEOUS) ×4

## 2017-01-25 NOTE — Discharge Instructions (Signed)

## 2017-01-25 NOTE — Op Note (Signed)
01/25/2017  11:10 AM  PATIENT:  Desiree Oneal  66 y.o. female  PRE-OPERATIVE DIAGNOSIS:   Full-thickness left shoulder with subacromial impingement and possible biceps tendon tear  POST-OPERATIVE DIAGNOSIS:  Full-thickness rotator cuff tear, left shoulder with subacromial impingement, glenohumeral osteoarthritis, biceps tendon partial tear, superior labral tear  PROCEDURE:  Procedure(s) with comments: LEFT SHOULDER ARTHROSCOPY WITH OPEN ROTATOR CUFF REPAIR and arthroscopic labral debridement, biceps tenotomy and chondroplasty of the humeral head  SURGEON:  Surgeon(s) and Role:    Thornton Park, MD - Primary  ANESTHESIA:   local, general and paracervical block   PREOPERATIVE INDICATIONS:  Desiree Oneal is a  66 y.o. female with a diagnosis of M75.122 Complete rotatr-cuff tear/ruptr of left shoulder, not trauma who failed conservative measures and elected for surgical management.    The risks benefits and alternatives were discussed with the patient preoperatively including but not limited to the risks of infection, bleeding, nerve injury, persistent pain or weakness, failure of the hardware, re-tear of the rotator cuff and the need for further surgery. Medical risks include DVT and pulmonary embolism, myocardial infarction, stroke, pneumonia, respiratory failure and death. Patient understood these risks and wished to proceed.  OPERATIVE IMPLANTS: ArthroCare Magnum 2 anchors x 3 & [employer]hew Q Fix anchor x 1  OPERATIVE PROCEDURE: The patient was met in the preoperative area with her son and daughter at the bedside. The left shoulder was signed with the word yes and my initials according the hospital's correct site of surgery protocol. Patient had an interscalene block for the left shoulder by the anesthesia service in the preoperative area.The patient is brought to the OR and underwent  general endotracheal intubation by the anesthesia service.  The patient was placed in a  beachchair position. A spider arm positioner was used for this case. Examination under anesthesia revealed full passive range of motion without instability to load-and-shift testing and a negative sulcus sign.  The patient was prepped and draped in a sterile fashion. A timeout was performed to verify the patient's name, date of birth, medical record number, correct site of surgery and correct procedure to be performed there was also used to verify the patient received antibiotics that all appropriate instruments, implants and radiographs studies were available in the room. Once all in attendance were in agreement case began.  Bony landmarks were drawn out with a surgical marker along with proposed arthroscopy incisions. These were pre-injected with 1% lidocaine plain. An 11 blade was used to establish a posterior portal through which the arthroscope was placed in the glenohumeral joint. A full diagnostic examination of the shoulder was performed. The anterior portal was established under direct visualization with an 18-gauge spinal needle.  A 5.75 mm arthroscopic cannula was placed through the anterior portal.   The intra-articular portion of the biceps tendon was found to have a partial tear involving greater than 50% of the diameter. Therefore the decision was made to perform a tenotomy. An arthroscopic scissor was used to release the biceps tendon off the superior labrum. The arthroscopic shaver was then used to debride the frayed edges of the labrum.   Subscapularis tendon was intact. Patient had a high-grade partial thickness tear involving the supraspinatus. There were no loose bodies within the inferior recess and no evidence of HAGL lesion.  A 0 PDS suture was placed into the supraspinatus for identification from the subacromial space.  The arthroscope was then placed in the subacromial space. A lateral portal was then  established using an 18-gauge spinal needle for localization.   0 PDS suture was  identified. Patient had a large subacromial spur. a subacromial decompression was performed using a 5.5 mm resector shaver blade.  The greater tuberosity was debrided using a 4.0 mm resector shaver blade to remove all remaining intact fibers of the rotator cuff.  Debridement was performed until punctate bleeding was seen at the greater tuberosity footprint, which will allow for rotator cuff healing.  Two ArthroCare Perfect Pass sutures were placed in the lateral border of the rotator cuff tear. All arthroscopic instruments were then removed and the mini-open portion of the procedure began.   A saber-type incision was made along the lateral border of the acromion. The deltoid muscle was identified and split in line with its fibers which allowed visualization of the rotator cuff. The Perfect Pass sutures previously placed in the lateral border of the rotator cuff werealso brought out through the deltoid split.A single Q-Fix anchor was then placed at the articular margin of the humeral head and greater tuberosity. The four suture limbs of the Q Fix anchor were passed medially through the rotator cuff using a first pass suture passer. a third perfect pass suture was then passed through the lateral border of the rotator cuff.The Perfect Pass sutures from the lateral border of the rotator cuff were then anchored to thegreater tuberosity of the humeral head using three Magnum 2 anchors. These anchors were tensioned to allow for anatomic reduction of the rotator cuff to the greater tuberosity footprint. The medial row repair was then completed using an arthroscopic knot tying technique with the Q fix anchor sutures. Once all sutures were tied down, arthroscopic images of the double row repair were taken with the arthroscope both externally and arthroscopically fromthe glenohumeral joint  All incisions were copiously irrigated. The deltoid fascia was repaired using a 0 Vicryl suturean interrupted fashion. The  subcutaneous tissue of all incisions were closed with a 2-0 Vicryl. Skin closure for the arthroscopic incisions was performed with 4-0 nylon. The skin edges of the saber incision were approximated with a running 4-0 undyed Monocryl. 0.25%  Marcaine plain was injected into the subacromial space and at the injection sites.  A dry sterile dressing including Steri-Strips was applied . The patient was placed in an abduction sling, with a Polar Care sleeve.  All sharp and instrument counts were correct at the conclusion of the case. I was scrubbed and present for the entire case. I spoke with the patient's daughter in the post-op consultation room and informed her that the case had been performed without complication and the patient was stable in recovery room.     Timoteo Gaul, MD

## 2017-01-25 NOTE — Transfer of Care (Signed)
Immediate Anesthesia Transfer of Care Note  Patient: Desiree Oneal  Procedure(s) Performed: Procedure(s) with comments: SHOULDER ARTHROSCOPY WITH OPEN ROTATOR CUFF REPAIR (Left) - labral debridement biceps tenotomy CHONDROPLASTY - humeral head  Patient Location: PACU  Anesthesia Type:General  Level of Consciousness: awake  Airway & Oxygen Therapy: Patient Spontanous Breathing  Post-op Assessment: Report given to RN  Post vital signs: stable  Last Vitals:  Vitals:   01/25/17 0749 01/25/17 1105  BP: 140/87 (!) 146/89  Pulse: 71 65  Resp: (!) 21 15  Temp:  36.6 C  SpO2: 99% 97%    Last Pain:  Vitals:   01/25/17 0618  TempSrc: Temporal         Complications: No apparent anesthesia complications

## 2017-01-25 NOTE — Anesthesia Preprocedure Evaluation (Signed)
Anesthesia Evaluation  Patient identified by MRN, date of birth, ID band Patient awake    Reviewed: Allergy & Precautions, NPO status , Patient's Chart, lab work & pertinent test results  History of Anesthesia Complications Negative for: history of anesthetic complications  Airway Mallampati: III  TM Distance: >3 FB Neck ROM: Full    Dental  (+) Dental Advidsory Given, Teeth Intact   Pulmonary neg pulmonary ROS, former smoker,           Cardiovascular Exercise Tolerance: Good hypertension, Pt. on medications (-) angina(-) CAD, (-) Past MI, (-) Cardiac Stents and (-) CABG (-) dysrhythmias (-) Valvular Problems/Murmurs     Neuro/Psych neg Seizures  Neuromuscular disease (trigeminal neuralgia) negative psych ROS   GI/Hepatic negative GI ROS, Neg liver ROS,   Endo/Other  negative endocrine ROS  Renal/GU negative Renal ROS  negative genitourinary   Musculoskeletal   Abdominal   Peds  Hematology negative hematology ROS (+)   Anesthesia Other Findings Past Medical History: No date: Abnormal mammogram, unspecified No date: Hyperlipidemia 2013: Lump or mass in breast No date: Obesity, unspecified No date: Personal history of tobacco use, presenting hazards to health No date: Special screening for malignant neoplasms, colon 2000: Trigeminal neuralgia of left side of face 2011: Urinary retention   Reproductive/Obstetrics negative OB ROS                             Anesthesia Physical  Anesthesia Plan  ASA: II  Anesthesia Plan: General and Regional   Post-op Pain Management: GA combined w/ Regional for post-op pain   Induction: Intravenous  PONV Risk Score and Plan: 3 and Ondansetron, Dexamethasone and Midazolam  Airway Management Planned: Oral ETT  Additional Equipment:   Intra-op Plan:   Post-operative Plan: Extubation in OR  Informed Consent: I have reviewed the patients  History and Physical, chart, labs and discussed the procedure including the risks, benefits and alternatives for the proposed anesthesia with the patient or authorized representative who has indicated his/her understanding and acceptance.     Plan Discussed with: CRNA and Surgeon  Anesthesia Plan Comments:         Anesthesia Quick Evaluation

## 2017-01-25 NOTE — Anesthesia Procedure Notes (Signed)
Procedure Name: Intubation Date/Time: 01/25/2017 8:08 AM Performed by: Leander Rams Pre-anesthesia Checklist: Patient identified, Emergency Drugs available, Suction available, Patient being monitored and Timeout performed Patient Re-evaluated:Patient Re-evaluated prior to induction Oxygen Delivery Method: Circle system utilized Preoxygenation: Pre-oxygenation with 100% oxygen Induction Type: IV induction Ventilation: Mask ventilation without difficulty Laryngoscope Size: Glidescope Grade View: Grade III Tube type: Oral Tube size: 7.0 mm Number of attempts: 2 (DL view 3, Glidescope view 1) Airway Equipment and Method: Stylet and Video-laryngoscopy Placement Confirmation: ETT inserted through vocal cords under direct vision,  positive ETCO2 and breath sounds checked- equal and bilateral Secured at: 23 cm Tube secured with: Tape Dental Injury: Teeth and Oropharynx as per pre-operative assessment  Difficulty Due To: Difficult Airway- due to anterior larynx Future Recommendations: Recommend- induction with short-acting agent, and alternative techniques readily available

## 2017-01-25 NOTE — Anesthesia Procedure Notes (Signed)
Anesthesia Regional Block: Interscalene brachial plexus block   Pre-Anesthetic Checklist: ,, timeout performed, Correct Patient, Correct Site, Correct Laterality, Correct Procedure, Correct Position, site marked, Risks and benefits discussed,  Surgical consent,  Pre-op evaluation,  At surgeon's request and post-op pain management  Laterality: Left and Upper  Prep: chloraprep       Needles:  Injection technique: Single-shot  Needle Type: Stimiplex     Needle Length: 5cm  Needle Gauge: 22     Additional Needles:   Procedures: ultrasound guided,,,,,,,,  Narrative:  Start time: 01/25/2017 7:44 AM End time: 01/25/2017 7:48 AM Injection made incrementally with aspirations every 5 mL.  Performed by: Personally  Anesthesiologist: Martha Clan  Additional Notes: Functioning IV was confirmed and monitors were applied.  A 24mm 22ga Stimuplex needle was used. Sterile prep and drape,hand hygiene and sterile gloves were used.  Negative aspiration and negative test dose prior to incremental administration of local anesthetic. The patient tolerated the procedure well.

## 2017-01-25 NOTE — OR Nursing (Signed)
Finger of left hand warm.  Radial pulse 3+.  Unable to feel hand or move fingers

## 2017-01-25 NOTE — H&P (Signed)
The patient has been re-examined, and the chart reviewed, and there have been no interval changes to the documented history and physical.    The risks, benefits, and alternatives have been discussed at length, and the patient is willing to proceed.   

## 2017-01-25 NOTE — Progress Notes (Signed)
Patient states she is not able to move her fingers on her Left hand just yet, warm to touch and pink.  Not able To feel RN touch her skin as of yet either.

## 2017-01-25 NOTE — Anesthesia Post-op Follow-up Note (Signed)
Anesthesia QCDR form completed.        

## 2017-01-26 ENCOUNTER — Encounter: Payer: Self-pay | Admitting: Orthopedic Surgery

## 2017-01-27 NOTE — Anesthesia Postprocedure Evaluation (Signed)
Anesthesia Post Note  Patient: STEPHANEE BARCOMB  Procedure(s) Performed: Procedure(s) (LRB): SHOULDER ARTHROSCOPY WITH OPEN ROTATOR CUFF REPAIR (Left) CHONDROPLASTY  Patient location during evaluation: PACU Anesthesia Type: Regional and General Level of consciousness: awake and alert Pain management: pain level controlled Vital Signs Assessment: post-procedure vital signs reviewed and stable Respiratory status: spontaneous breathing, nonlabored ventilation, respiratory function stable and patient connected to nasal cannula oxygen Cardiovascular status: blood pressure returned to baseline and stable Postop Assessment: no apparent nausea or vomiting Anesthetic complications: no     Last Vitals:  Vitals:   01/25/17 1201 01/25/17 1255  BP: (!) 153/87 139/82  Pulse: (!) 59 65  Resp: 16 16  Temp: 36.4 C   SpO2: 97% 98%    Last Pain:  Vitals:   01/26/17 0834  TempSrc:   PainSc: 0-No pain                 Martha Clan

## 2017-03-23 ENCOUNTER — Other Ambulatory Visit: Payer: Self-pay | Admitting: Family Medicine

## 2017-03-23 DIAGNOSIS — Z1231 Encounter for screening mammogram for malignant neoplasm of breast: Secondary | ICD-10-CM

## 2017-04-14 ENCOUNTER — Ambulatory Visit
Admission: RE | Admit: 2017-04-14 | Discharge: 2017-04-14 | Disposition: A | Discharge status: Home / Self Care | Payer: BC Managed Care – PPO | Source: Ambulatory Visit | Attending: Family Medicine | Admitting: Family Medicine

## 2017-04-14 DIAGNOSIS — Z1231 Encounter for screening mammogram for malignant neoplasm of breast: Secondary | ICD-10-CM | POA: Diagnosis not present

## 2018-03-28 ENCOUNTER — Other Ambulatory Visit: Payer: Self-pay | Admitting: Family Medicine

## 2018-03-28 DIAGNOSIS — Z1231 Encounter for screening mammogram for malignant neoplasm of breast: Secondary | ICD-10-CM

## 2018-04-06 DIAGNOSIS — H524 Presbyopia: Secondary | ICD-10-CM | POA: Diagnosis not present

## 2018-04-26 ENCOUNTER — Ambulatory Visit
Admission: RE | Admit: 2018-04-26 | Discharge: 2018-04-26 | Disposition: A | Discharge status: Home / Self Care | Payer: PPO | Source: Ambulatory Visit | Attending: Family Medicine | Admitting: Family Medicine

## 2018-04-26 DIAGNOSIS — Z1231 Encounter for screening mammogram for malignant neoplasm of breast: Secondary | ICD-10-CM | POA: Insufficient documentation

## 2018-05-16 DIAGNOSIS — H40003 Preglaucoma, unspecified, bilateral: Secondary | ICD-10-CM | POA: Diagnosis not present

## 2018-05-30 DIAGNOSIS — R030 Elevated blood-pressure reading, without diagnosis of hypertension: Secondary | ICD-10-CM | POA: Diagnosis not present

## 2018-05-30 DIAGNOSIS — E785 Hyperlipidemia, unspecified: Secondary | ICD-10-CM | POA: Diagnosis not present

## 2018-05-30 DIAGNOSIS — G500 Trigeminal neuralgia: Secondary | ICD-10-CM | POA: Diagnosis not present

## 2018-05-30 DIAGNOSIS — E119 Type 2 diabetes mellitus without complications: Secondary | ICD-10-CM | POA: Diagnosis not present

## 2018-06-06 DIAGNOSIS — Z1211 Encounter for screening for malignant neoplasm of colon: Secondary | ICD-10-CM | POA: Diagnosis not present

## 2018-06-06 DIAGNOSIS — Z23 Encounter for immunization: Secondary | ICD-10-CM | POA: Diagnosis not present

## 2018-06-06 DIAGNOSIS — Z0000 Encounter for general adult medical examination without abnormal findings: Secondary | ICD-10-CM | POA: Diagnosis not present

## 2018-06-06 DIAGNOSIS — E119 Type 2 diabetes mellitus without complications: Secondary | ICD-10-CM | POA: Diagnosis not present

## 2018-06-06 DIAGNOSIS — E785 Hyperlipidemia, unspecified: Secondary | ICD-10-CM | POA: Diagnosis not present

## 2018-06-06 DIAGNOSIS — G4700 Insomnia, unspecified: Secondary | ICD-10-CM | POA: Diagnosis not present

## 2018-06-06 DIAGNOSIS — N3281 Overactive bladder: Secondary | ICD-10-CM | POA: Diagnosis not present

## 2018-07-03 DIAGNOSIS — Z1211 Encounter for screening for malignant neoplasm of colon: Secondary | ICD-10-CM | POA: Diagnosis not present

## 2018-07-03 DIAGNOSIS — Z800 Family history of malignant neoplasm of digestive organs: Secondary | ICD-10-CM | POA: Diagnosis not present

## 2018-07-03 DIAGNOSIS — K5900 Constipation, unspecified: Secondary | ICD-10-CM | POA: Diagnosis not present

## 2018-09-01 ENCOUNTER — Ambulatory Visit: Admission: RE | Admit: 2018-09-01 | Payer: PPO | Source: Home / Self Care | Admitting: Gastroenterology

## 2018-09-01 ENCOUNTER — Encounter: Admission: RE | Payer: Self-pay | Source: Home / Self Care

## 2018-09-01 SURGERY — COLONOSCOPY WITH PROPOFOL
Anesthesia: General

## 2018-09-05 DIAGNOSIS — E119 Type 2 diabetes mellitus without complications: Secondary | ICD-10-CM | POA: Diagnosis not present

## 2018-09-05 DIAGNOSIS — J309 Allergic rhinitis, unspecified: Secondary | ICD-10-CM | POA: Diagnosis not present

## 2018-09-07 DIAGNOSIS — E119 Type 2 diabetes mellitus without complications: Secondary | ICD-10-CM | POA: Diagnosis not present

## 2019-01-08 DIAGNOSIS — G4700 Insomnia, unspecified: Secondary | ICD-10-CM | POA: Diagnosis not present

## 2019-01-08 DIAGNOSIS — B009 Herpesviral infection, unspecified: Secondary | ICD-10-CM | POA: Diagnosis not present

## 2019-01-08 DIAGNOSIS — E785 Hyperlipidemia, unspecified: Secondary | ICD-10-CM | POA: Diagnosis not present

## 2019-01-08 DIAGNOSIS — E119 Type 2 diabetes mellitus without complications: Secondary | ICD-10-CM | POA: Diagnosis not present

## 2019-01-23 ENCOUNTER — Other Ambulatory Visit
Admission: RE | Admit: 2019-01-23 | Discharge: 2019-01-23 | Disposition: A | Discharge status: Home / Self Care | Payer: PPO | Source: Ambulatory Visit | Attending: Gastroenterology | Admitting: Gastroenterology

## 2019-01-23 ENCOUNTER — Other Ambulatory Visit: Payer: Self-pay

## 2019-01-23 DIAGNOSIS — Z20828 Contact with and (suspected) exposure to other viral communicable diseases: Secondary | ICD-10-CM | POA: Insufficient documentation

## 2019-01-23 DIAGNOSIS — Z01812 Encounter for preprocedural laboratory examination: Secondary | ICD-10-CM | POA: Diagnosis not present

## 2019-01-23 LAB — SARS CORONAVIRUS 2 (TAT 6-24 HRS): SARS Coronavirus 2: NEGATIVE

## 2019-01-25 ENCOUNTER — Ambulatory Visit: Payer: PPO | Admitting: Anesthesiology

## 2019-01-25 ENCOUNTER — Encounter
Admission: RE | Disposition: A | Discharge status: Home / Self Care | Payer: Self-pay | Source: Home / Self Care | Attending: Gastroenterology

## 2019-01-25 ENCOUNTER — Encounter: Payer: Self-pay | Admitting: *Deleted

## 2019-01-25 ENCOUNTER — Other Ambulatory Visit: Payer: Self-pay

## 2019-01-25 ENCOUNTER — Ambulatory Visit
Admission: RE | Admit: 2019-01-25 | Discharge: 2019-01-25 | Disposition: A | Discharge status: Home / Self Care | Payer: PPO | Attending: Gastroenterology | Admitting: Gastroenterology

## 2019-01-25 DIAGNOSIS — K5730 Diverticulosis of large intestine without perforation or abscess without bleeding: Secondary | ICD-10-CM | POA: Diagnosis not present

## 2019-01-25 DIAGNOSIS — K640 First degree hemorrhoids: Secondary | ICD-10-CM | POA: Diagnosis not present

## 2019-01-25 DIAGNOSIS — Z6833 Body mass index (BMI) 33.0-33.9, adult: Secondary | ICD-10-CM | POA: Insufficient documentation

## 2019-01-25 DIAGNOSIS — Z1211 Encounter for screening for malignant neoplasm of colon: Secondary | ICD-10-CM | POA: Insufficient documentation

## 2019-01-25 DIAGNOSIS — Z791 Long term (current) use of non-steroidal anti-inflammatories (NSAID): Secondary | ICD-10-CM | POA: Diagnosis not present

## 2019-01-25 DIAGNOSIS — Z87891 Personal history of nicotine dependence: Secondary | ICD-10-CM | POA: Insufficient documentation

## 2019-01-25 DIAGNOSIS — I10 Essential (primary) hypertension: Secondary | ICD-10-CM | POA: Insufficient documentation

## 2019-01-25 DIAGNOSIS — E785 Hyperlipidemia, unspecified: Secondary | ICD-10-CM | POA: Diagnosis not present

## 2019-01-25 DIAGNOSIS — Z800 Family history of malignant neoplasm of digestive organs: Secondary | ICD-10-CM | POA: Insufficient documentation

## 2019-01-25 DIAGNOSIS — E669 Obesity, unspecified: Secondary | ICD-10-CM | POA: Insufficient documentation

## 2019-01-25 DIAGNOSIS — Z79899 Other long term (current) drug therapy: Secondary | ICD-10-CM | POA: Diagnosis not present

## 2019-01-25 DIAGNOSIS — K648 Other hemorrhoids: Secondary | ICD-10-CM | POA: Diagnosis not present

## 2019-01-25 DIAGNOSIS — K5790 Diverticulosis of intestine, part unspecified, without perforation or abscess without bleeding: Secondary | ICD-10-CM | POA: Diagnosis not present

## 2019-01-25 DIAGNOSIS — C189 Malignant neoplasm of colon, unspecified: Secondary | ICD-10-CM | POA: Diagnosis not present

## 2019-01-25 HISTORY — PX: COLONOSCOPY WITH PROPOFOL: SHX5780

## 2019-01-25 HISTORY — DX: Essential (primary) hypertension: I10

## 2019-01-25 HISTORY — DX: Herpesviral infection, unspecified: B00.9

## 2019-01-25 HISTORY — DX: Benign neoplasm of colon, unspecified: D12.6

## 2019-01-25 HISTORY — DX: Unspecified osteoarthritis, unspecified site: M19.90

## 2019-01-25 HISTORY — DX: Allergy status to unspecified drugs, medicaments and biological substances: Z88.9

## 2019-01-25 SURGERY — COLONOSCOPY WITH PROPOFOL
Anesthesia: General

## 2019-01-25 MED ORDER — PROPOFOL 500 MG/50ML IV EMUL
INTRAVENOUS | Status: AC
  Filled 2019-01-25: qty 50

## 2019-01-25 MED ORDER — PROPOFOL 10 MG/ML IV BOLUS
INTRAVENOUS | Status: DC | PRN
  Administered 2019-01-25: 50 mg via INTRAVENOUS

## 2019-01-25 MED ORDER — FENTANYL CITRATE (PF) 100 MCG/2ML IJ SOLN
INTRAMUSCULAR | Status: AC
  Filled 2019-01-25: qty 2

## 2019-01-25 MED ORDER — PROPOFOL 10 MG/ML IV BOLUS
INTRAVENOUS | Status: AC
  Filled 2019-01-25: qty 20

## 2019-01-25 MED ORDER — FENTANYL CITRATE (PF) 100 MCG/2ML IJ SOLN
INTRAMUSCULAR | Status: DC | PRN
  Administered 2019-01-25: 50 ug via INTRAVENOUS

## 2019-01-25 MED ORDER — PHENYLEPHRINE HCL (PRESSORS) 10 MG/ML IV SOLN
INTRAVENOUS | Status: AC
  Filled 2019-01-25: qty 1

## 2019-01-25 MED ORDER — PHENYLEPHRINE HCL (PRESSORS) 10 MG/ML IV SOLN
INTRAVENOUS | Status: DC | PRN
  Administered 2019-01-25: 100 ug via INTRAVENOUS

## 2019-01-25 MED ORDER — PROPOFOL 500 MG/50ML IV EMUL
INTRAVENOUS | Status: DC | PRN
  Administered 2019-01-25: 180 ug/kg/min via INTRAVENOUS

## 2019-01-25 MED ORDER — SODIUM CHLORIDE 0.9 % IV SOLN
INTRAVENOUS | Status: DC
  Administered 2019-01-25: 11:00:00 via INTRAVENOUS

## 2019-01-25 NOTE — Anesthesia Preprocedure Evaluation (Addendum)
Anesthesia Evaluation  Patient identified by MRN, date of birth, ID band Patient awake    Reviewed: Allergy & Precautions, H&P , NPO status , Patient's Chart, lab work & pertinent test results  Airway Mallampati: II  TM Distance: >3 FB     Dental  (+) Teeth Intact   Pulmonary former smoker,           Cardiovascular hypertension,      Neuro/Psych negative neurological ROS  negative psych ROS   GI/Hepatic negative GI ROS, Neg liver ROS,   Endo/Other  negative endocrine ROS  Renal/GU negative Renal ROS  negative genitourinary   Musculoskeletal   Abdominal   Peds  Hematology negative hematology ROS (+)   Anesthesia Other Findings Past Medical History: No date: Abnormal mammogram, unspecified No date: Allergic genetic state No date: Arthritis No date: 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 No date: Hyperlipidemia No date: Hypertension 2013: Lump or mass in breast No date: Obesity, unspecified No date: Personal history of tobacco use, presenting hazards to health No date: Special screening for malignant neoplasms, colon 2000: Trigeminal neuralgia of left side of face 2011: Urinary retention  Past Surgical History: No date: ABDOMINAL HYSTERECTOMY 2000: BRAIN SURGERY     Comment:  Trigeminal neuralgia 1980: BREAST BIOPSY; Right     Comment:  benign 1990: BREAST BIOPSY; Left     Comment:  benign 01/22/2016: CARPAL TUNNEL RELEASE; Right     Comment:  Procedure: RIGHT CARPAL TUNNEL RELEASE;  Surgeon: Leanora Cover, MD;  Location: [place];                Service: Orthopedics;  Laterality: Right; 01/25/2017: CHONDROPLASTY     Comment:  Procedure: CHONDROPLASTY;  Surgeon: Thornton Park,               MD;  Location: [facility];  Service: Orthopedics;;  humeral              head 2008: COLONOSCOPY     Comment:  Dr. Vira Agar No date: DESTRUCTION TRIGEMINAL NERVE VIA NEUROLYTIC  AGENT No date: ELBOW SURGERY; Right 2010: HAND SURGERY; Left 2011: HAND SURGERY; Right 1998: KNEE SURGERY; Right 04/20/2016: MASS EXCISION; Left     Comment:  Procedure: EXCISION OSTEOPHYTE LEFT LONG FINGER               DEBRIDEMENT DISTAL INTERPHALANGEAL JOINT;  Surgeon: Leanora Cover, MD;  Location: [place];                Service: Orthopedics;  Laterality: Left;  EXCISION               OSTEOPHYTE LEFT LONG FINGER DEBRIDEMENT DISTAL               INTERPHALANGEAL JOINT No date: MASTECTOMY PARTIAL / LUMPECTOMY No date: ROTATOR CUFF REPAIR; Right 01/25/2017: SHOULDER ARTHROSCOPY WITH OPEN ROTATOR CUFF REPAIR; Left     Comment:  Procedure: SHOULDER ARTHROSCOPY WITH OPEN ROTATOR CUFF               REPAIR;  Surgeon: Thornton Park, MD;  Location: [facility]               ORS;  Service: Orthopedics;  Laterality: Left;  labral               debridement biceps  tenotomy No date: thumb surgery due to fracture No date: TOE SURGERY No date: TONSILLECTOMY No date: TRIGGER FINGER RELEASE; Right     Reproductive/Obstetrics negative OB ROS                            Anesthesia Physical Anesthesia Plan  ASA: II  Anesthesia Plan: General   Post-op Pain Management:    Induction:   PONV Risk Score and Plan: Propofol infusion and TIVA  Airway Management Planned: Natural Airway and Nasal Cannula  Additional Equipment:   Intra-op Plan:   Post-operative Plan:   Informed Consent: I have reviewed the patients History and Physical, chart, labs and discussed the procedure including the risks, benefits and alternatives for the proposed anesthesia with the patient or authorized representative who has indicated his/her understanding and acceptance.     Dental Advisory Given  Plan Discussed with: Anesthesiologist and CRNA  Anesthesia Plan Comments:         Anesthesia Quick Evaluation

## 2019-01-25 NOTE — Transfer of Care (Signed)
Immediate Anesthesia Transfer of Care Note  Patient: CHANEA VANDERZANDEN  Procedure(s) Performed: COLONOSCOPY WITH PROPOFOL (N/A )  Patient Location: PACU  Anesthesia Type:General  Level of Consciousness: awake and alert   Airway & Oxygen Therapy: Patient Spontanous Breathing and Patient connected to nasal cannula oxygen  Post-op Assessment: Report given to RN and Post -op Vital signs reviewed and stable  Post vital signs: Reviewed and stable  Last Vitals:  Vitals Value Taken Time  BP 113/73 01/25/19 1057  Temp 36.4 C 01/25/19 1057  Pulse 67 01/25/19 1058  Resp 14 01/25/19 1058  SpO2 100 % 01/25/19 1058  Vitals shown include unvalidated device data.  Last Pain:  Vitals:   01/25/19 1057  TempSrc: Tympanic  PainSc: 0-No pain         Complications: No apparent anesthesia complications

## 2019-01-25 NOTE — Anesthesia Postprocedure Evaluation (Signed)
Anesthesia Post Note  Patient: Desiree Oneal  Procedure(s) Performed: COLONOSCOPY WITH PROPOFOL (N/A )  Patient location during evaluation: PACU Anesthesia Type: General Level of consciousness: awake and alert Pain management: pain level controlled Vital Signs Assessment: post-procedure vital signs reviewed and stable Respiratory status: spontaneous breathing, nonlabored ventilation and respiratory function stable Cardiovascular status: blood pressure returned to baseline and stable Postop Assessment: no apparent nausea or vomiting Anesthetic complications: no     Last Vitals:  Vitals:   01/25/19 1057 01/25/19 1107  BP: 113/73 136/84  Pulse: 67 64  Resp: 14 15  Temp: (!) 36.4 C   SpO2: 100% 100%    Last Pain:  Vitals:   01/25/19 1057  TempSrc: Tympanic  PainSc: 0-No pain                 Durenda Hurt

## 2019-01-25 NOTE — Op Note (Signed)
[facility]astroenter[facility] Patient Name: Desiree Oneal Procedure Date: 01/25/2019 9:19 AM MRN: [id number] Account #: [id number] Date of Birth: 1951/02/03 Admit Type: Outpatient Age: 68 Room: [facility] ENDO ROOM 3 Gender: Female Note Status: Finalized Procedure:            Colonoscopy Indications:          Screening in patient at increased risk: Family history                        of 1st-degree relative with colorectal cancer Providers:            Lollie Sails, MD Medicines:            Monitored Anesthesia Care Complications:        No immediate complications. Procedure:            Pre-Anesthesia Assessment:                       - ASA Grade Assessment: II - A patient with mild                        systemic disease.                       After obtaining informed consent, the colonoscope was                        passed under direct vision. Throughout the procedure,                        the patient's blood pressure, pulse, and oxygen                        saturations were monitored continuously. The was                        introduced through the anus and advanced to the the                        cecum, identified by appendiceal orifice and ileocecal                        valve. The colonoscopy was performed without                        difficulty. The patient tolerated the procedure well.                        The quality of the bowel preparation was good. Findings:      Multiple small-mouthed diverticula were found in the sigmoid colon and       descending colon.      Non-bleeding internal hemorrhoids were found during retroflexion and       during anoscopy. The hemorrhoids were Grade I (internal hemorrhoids that       do not prolapse).      No additional abnormalities were found on retroflexion. Impression:           - Diverticulosis in the sigmoid colon and in the                        descending colon.                       -  Non-bleeding  internal hemorrhoids.                       - No specimens collected. Recommendation:       - Discharge patient to home.                       - Advance diet as tolerated. Procedure Code(s):    --- Professional ---                       [phone], Colonoscopy, flexible; diagnostic, including                        collection of specimen(s) by brushing or washing, when                        performed (separate procedure) Diagnosis Code(s):    --- Professional ---                       Z80.0, Family history of malignant neoplasm of                        digestive organs                       K64.0, First degree hemorrhoids                       K57.30, Diverticulosis of large intestine without                        perforation or abscess without bleeding CPT copyright 2019 [employer]. All rights reserved. The codes documented in this report are preliminary and upon coder review may  be revised to meet current compliance requirements. Lollie Sails, MD 01/25/2019 10:52:16 AM This report has been signed electronically. Number of Addenda: 0 Note Initiated On: 01/25/2019 9:19 AM Scope Withdrawal Time: 0 hours 5 minutes 32 seconds  Total Procedure Duration: 0 hours 12 minutes 53 seconds       [facility]

## 2019-01-25 NOTE — Anesthesia Procedure Notes (Signed)
Date/Time: 01/25/2019 10:35 AM Performed by: Allean Found, CRNA Pre-anesthesia Checklist: Patient identified, Emergency Drugs available, Suction available, Patient being monitored and Timeout performed Oxygen Delivery Method: Nasal cannula Placement Confirmation: positive ETCO2

## 2019-01-25 NOTE — Progress Notes (Signed)
Patient comfortable being discharged without talking to DR in person about procedure results. MD aware. Patient told to eat soft diet today and resume regular diet tomorrow.

## 2019-01-25 NOTE — H&P (Signed)
Outpatient short stay form Pre-procedure 01/25/2019 9:38 AM Lollie Sails MD  Primary Physician: Dr. Maryland Pink  Reason for visit: Colonoscopy  History of present illness: Patient is a 68 year old female presenting today for colonoscopy in regards to a family history of possible colon cancer in a primary relative.  Patient's last colonoscopy was in 2010.  Tolerated her prep well.  She takes no aspirin or blood thinning agent.    Current Facility-Administered Medications:  .  0.9 %  sodium chloride infusion, , Intravenous, Continuous, Lollie Sails, MD  Medications Prior to Admission  Medication Sig Dispense Refill Last Dose  . carbamazepine (TEGRETOL) 200 MG tablet Take 200 mg by mouth 3 (three) times daily.    01/24/2019 at Unknown time  . fexofenadine-pseudoephedrine (ALLEGRA-D) 60-120 MG 12 hr tablet Take 1 tablet by mouth 2 (two) times daily.   Past Week at Unknown time  . meloxicam (MOBIC) 15 MG tablet Take 15 mg by mouth daily.   01/24/2019 at Unknown time  . methocarbamol (ROBAXIN) 500 MG tablet Take 500 mg by mouth every 8 (eight) hours as needed for muscle spasms.   Past Month at Unknown time  . oxybutynin (DITROPAN-XL) 10 MG 24 hr tablet Take 10 mg by mouth at bedtime.   01/24/2019 at Unknown time  . pravastatin (PRAVACHOL) 80 MG tablet Take 80 mg by mouth daily.   Past Week at Unknown time  . traZODone (DESYREL) 100 MG tablet Take 100 mg by mouth at bedtime.   01/24/2019 at Unknown time  . triamcinolone cream (KENALOG) 0.1 % Apply 1 application topically 2 (two) times daily.   Past Month at Unknown time  . valACYclovir (VALTREX) 500 MG tablet Take 500 mg by mouth daily as needed.    01/24/2019 at Unknown time  . zolpidem (AMBIEN) 10 MG tablet Take 10 mg by mouth at bedtime as needed for sleep.   01/24/2019 at Unknown time  . acyclovir (ZOVIRAX) 200 MG capsule Take 200 mg by mouth 2 (two) times daily.     . ondansetron (ZOFRAN) 4 MG tablet Take 1 tablet (4 mg total) by mouth  every 8 (eight) hours as needed for nausea or vomiting. 30 tablet 0   . oxybutynin (DITROPAN) 5 MG tablet Take 5 mg by mouth 2 (two) times daily.     Marland Kitchen oxyCODONE (OXY IR/ROXICODONE) 5 MG immediate release tablet Take 1 tablet (5 mg total) by mouth every 4 (four) hours as needed for severe pain. 40 tablet 0   . pravastatin (PRAVACHOL) 40 MG tablet Take 40 mg by mouth daily.         No Known Allergies   Past Medical History:  Diagnosis Date  . Abnormal mammogram, unspecified   . Allergic genetic state   . Arthritis   . Benign neoplasm of colon   . HSV-2 infection   . Hyperlipidemia   . Hypertension   . Lump or mass in breast 2013  . Obesity, unspecified   . Personal history of tobacco use, presenting hazards to health   . Special screening for malignant neoplasms, colon   . Trigeminal neuralgia of left side of face 2000  . Urinary retention 2011    Review of systems:      Physical Exam    Heart and lungs: Regular rate and rhythm without rub or gallop lungs are bilaterally clear    HEENT: Cephalic atraumatic eyes are anicteric    Other:     Pertinant exam for procedure: Soft  nontender nondistended bowel sounds positive normoactive    Planned proceedures: Colonoscopy and indicated procedures. I have discussed the risks benefits and complications of procedures to include not limited to bleeding, infection, perforation and the risk of sedation and the patient wishes to proceed.    Lollie Sails, MD Gastroenterology 01/25/2019  9:38 AM

## 2019-01-25 NOTE — Anesthesia Post-op Follow-up Note (Signed)
Anesthesia QCDR form completed.        

## 2019-01-26 ENCOUNTER — Encounter: Payer: Self-pay | Admitting: Gastroenterology

## 2019-03-12 ENCOUNTER — Other Ambulatory Visit: Payer: Self-pay

## 2019-03-12 DIAGNOSIS — Z20822 Contact with and (suspected) exposure to covid-19: Secondary | ICD-10-CM

## 2019-03-13 LAB — NOVEL CORONAVIRUS, NAA: SARS-CoV-2, NAA: NOT DETECTED

## 2019-06-11 ENCOUNTER — Other Ambulatory Visit: Payer: Self-pay | Admitting: Family Medicine

## 2019-06-11 DIAGNOSIS — Z1231 Encounter for screening mammogram for malignant neoplasm of breast: Secondary | ICD-10-CM

## 2019-07-04 ENCOUNTER — Ambulatory Visit
Admission: RE | Admit: 2019-07-04 | Discharge: 2019-07-04 | Disposition: A | Discharge status: Home / Self Care | Payer: Medicare Other | Source: Ambulatory Visit | Attending: Family Medicine | Admitting: Family Medicine

## 2019-07-04 DIAGNOSIS — Z1231 Encounter for screening mammogram for malignant neoplasm of breast: Secondary | ICD-10-CM | POA: Diagnosis not present

## 2019-07-10 ENCOUNTER — Other Ambulatory Visit: Payer: Self-pay | Admitting: Family Medicine

## 2019-07-10 DIAGNOSIS — N6489 Other specified disorders of breast: Secondary | ICD-10-CM

## 2019-07-10 DIAGNOSIS — R928 Other abnormal and inconclusive findings on diagnostic imaging of breast: Secondary | ICD-10-CM

## 2019-07-11 ENCOUNTER — Ambulatory Visit
Admission: RE | Admit: 2019-07-11 | Discharge: 2019-07-11 | Disposition: A | Discharge status: Home / Self Care | Payer: Medicare Other | Source: Ambulatory Visit | Attending: Family Medicine | Admitting: Family Medicine

## 2019-07-11 DIAGNOSIS — R928 Other abnormal and inconclusive findings on diagnostic imaging of breast: Secondary | ICD-10-CM

## 2019-07-11 DIAGNOSIS — N6489 Other specified disorders of breast: Secondary | ICD-10-CM | POA: Insufficient documentation

## 2019-12-12 ENCOUNTER — Other Ambulatory Visit: Payer: Self-pay

## 2019-12-12 ENCOUNTER — Ambulatory Visit: Payer: Medicare Other | Admitting: Physician Assistant

## 2019-12-12 VITALS — BP 148/86 | HR 84 | Temp 98.7°F | Resp 18 | Ht 64.0 in | Wt 199.0 lb

## 2019-12-12 DIAGNOSIS — E119 Type 2 diabetes mellitus without complications: Secondary | ICD-10-CM

## 2019-12-12 DIAGNOSIS — Z6834 Body mass index (BMI) 34.0-34.9, adult: Secondary | ICD-10-CM

## 2019-12-12 DIAGNOSIS — Z713 Dietary counseling and surveillance: Secondary | ICD-10-CM

## 2019-12-12 DIAGNOSIS — E6609 Other obesity due to excess calories: Secondary | ICD-10-CM

## 2019-12-12 LAB — POCT GLYCOSYLATED HEMOGLOBIN (HGB A1C): Hemoglobin A1C: 9.9 % — AB (ref 4.0–5.6)

## 2019-12-12 NOTE — Progress Notes (Signed)
New Patient Office Visit  Subjective:  Patient ID: Desiree Oneal, female    DOB: Oct 21, 1950  Age: 69 y.o. MRN: [id number]  CC:  Chief Complaint  Patient presents with  . Diabetes    HPI Desiree Oneal reports that she is treated for diabetes, does not check blood sugar at home, has recently started wanting to work on improving her diet, has been going to the [facility] for physical fitness.  Reports that she has started drinking more water, has been avoiding soda and juices, reports that she enjoys fried fish, eats a lot of vegetables out of her garden.    Past Medical History:  Diagnosis Date  . Abnormal mammogram, unspecified   . Allergic genetic state   . Arthritis   . Benign neoplasm of colon   . HSV-2 infection   . Hyperlipidemia   . Hypertension   . Lump or mass in breast 2013  . Obesity, unspecified   . Personal history of tobacco use, presenting hazards to health   . Special screening for malignant neoplasms, colon   . Trigeminal neuralgia of left side of face 2000  . Urinary retention 2011    Past Surgical History:  Procedure Laterality Date  . ABDOMINAL HYSTERECTOMY    . BRAIN SURGERY  2000   Trigeminal neuralgia  . BREAST BIOPSY Right 1980   benign (cyst) per pt  . BREAST BIOPSY Left 1990   benign  . CARPAL TUNNEL RELEASE Right 01/22/2016   Procedure: RIGHT CARPAL TUNNEL RELEASE;  Surgeon: Leanora Cover, MD;  Location: [place];  Service: Orthopedics;  Laterality: Right;  . CHONDROPLASTY  01/25/2017   Procedure: CHONDROPLASTY;  Surgeon: Thornton Park, MD;  Location: [facility];  Service: Orthopedics;;  humeral head  . COLONOSCOPY  2008   Dr. Vira Agar  . COLONOSCOPY WITH PROPOFOL N/A 01/25/2019   Procedure: COLONOSCOPY WITH PROPOFOL;  Surgeon: Lollie Sails, MD;  Location: [facility]COPY;  Service: Endoscopy;  Laterality: N/A;  . DESTRUCTION TRIGEMINAL NERVE VIA NEUROLYTIC AGENT    . ELBOW SURGERY Right   . HAND SURGERY Left 2010  .  HAND SURGERY Right 2011  . KNEE SURGERY Right 1998  . MASS EXCISION Left 04/20/2016   Procedure: EXCISION OSTEOPHYTE LEFT LONG FINGER DEBRIDEMENT DISTAL INTERPHALANGEAL JOINT;  Surgeon: Leanora Cover, MD;  Location: [place];  Service: Orthopedics;  Laterality: Left;  EXCISION OSTEOPHYTE LEFT LONG FINGER DEBRIDEMENT DISTAL INTERPHALANGEAL JOINT  . ROTATOR CUFF REPAIR Right   . SHOULDER ARTHROSCOPY WITH OPEN ROTATOR CUFF REPAIR Left 01/25/2017   Procedure: SHOULDER ARTHROSCOPY WITH OPEN ROTATOR CUFF REPAIR;  Surgeon: Thornton Park, MD;  Location: [facility];  Service: Orthopedics;  Laterality: Left;  labral debridement biceps tenotomy  . thumb surgery due to fracture    . TOE SURGERY    . TONSILLECTOMY    . TRIGGER FINGER RELEASE Right     Family History  Problem Relation Age of Onset  . Breast cancer Maternal Grandmother   . Blindness Mother   . Breast cancer Cousin        maternal    Social History   Socioeconomic History  . Marital status: Married    Spouse name: Not on file  . Number of children: Not on file  . Years of education: Not on file  . Highest education level: Not on file  Occupational History  . Occupation: Bus/Shuttle Education administrator: [employer]  Tobacco Use  .  Smoking status: Former Smoker    Packs/day: 1.00    Years: 20.00    Pack years: 20.00  . Smokeless tobacco: Never Used  Vaping Use  . Vaping Use: Never used  Substance and Sexual Activity  . Alcohol use: No    Alcohol/week: 0.0 standard drinks  . Drug use: No  . Sexual activity: Not on file  Other Topics Concern  . Not on file  Social History Narrative   Lives with husband, Jeneen Rinks.    Social Determinants of Health   Financial Resource Strain:   . Difficulty of Paying Living Expenses:   Food Insecurity:   . Worried About Charity fundraiser in the Last Year:   . Arboriculturist in the Last Year:   Transportation Needs:   . Film/video editor (Medical):   Marland Kitchen  Lack of Transportation (Non-Medical):   Physical Activity:   . Days of Exercise per Week:   . Minutes of Exercise per Session:   Stress:   . Feeling of Stress :   Social Connections:   . Frequency of Communication with Friends and Family:   . Frequency of Social Gatherings with Friends and Family:   . Attends Religious Services:   . Active Member of Clubs or Organizations:   . Attends Archivist Meetings:   Marland Kitchen Marital Status:   Intimate Partner Violence:   . Fear of Current or Ex-Partner:   . Emotionally Abused:   Marland Kitchen Physically Abused:   . Sexually Abused:     ROS Review of Systems  Constitutional: Negative.   HENT: Negative.   Eyes: Negative.   Respiratory: Negative.   Cardiovascular: Negative.   Gastrointestinal: Negative.   Endocrine: Negative.   Genitourinary: Negative.   Musculoskeletal: Negative.   Skin: Negative.   Allergic/Immunologic: Negative.   Neurological: Negative.   Hematological: Negative.   Psychiatric/Behavioral: Negative.     Objective:   Today's Vitals: BP (!) 148/86 (BP Location: Left Arm, Patient Position: Sitting, Cuff Size: Large)   Pulse 84   Temp 98.7 F (37.1 C) (Oral)   Resp 18   Ht 5\' 4"  (1.626 m)   Wt 199 lb (90.3 kg)   SpO2 100%   BMI 34.16 kg/m   Physical Exam Vitals and nursing note reviewed.  Constitutional:      Appearance: Normal appearance. She is obese.  HENT:     Head: Normocephalic and atraumatic.     Right Ear: External ear normal.     Left Ear: External ear normal.     Nose: Nose normal.     Mouth/Throat:     Mouth: Mucous membranes are moist.     Pharynx: Oropharynx is clear.  Eyes:     Extraocular Movements: Extraocular movements intact.     Conjunctiva/sclera: Conjunctivae normal.     Pupils: Pupils are equal, round, and reactive to light.  Cardiovascular:     Rate and Rhythm: Normal rate and regular rhythm.     Pulses: Normal pulses.     Heart sounds: Normal heart sounds.  Pulmonary:      Effort: Pulmonary effort is normal.     Breath sounds: Normal breath sounds.  Abdominal:     General: Abdomen is flat.     Palpations: Abdomen is soft.  Musculoskeletal:     Cervical back: Normal range of motion and neck supple.  Neurological:     Mental Status: She is alert.     Assessment & Plan:   Problem  List Items Addressed This Visit      Endocrine   Type 2 diabetes mellitus without complication, without long-term current use of insulin ([place]) - Primary   Relevant Orders   HgB A1c (Completed)      Outpatient Encounter Medications as of 12/12/2019  Medication Sig  . acyclovir (ZOVIRAX) 200 MG capsule Take 200 mg by mouth 2 (two) times daily.  . carbamazepine (TEGRETOL) 200 MG tablet Take 200 mg by mouth 3 (three) times daily.   . fexofenadine-pseudoephedrine (ALLEGRA-D) 60-120 MG 12 hr tablet Take 1 tablet by mouth 2 (two) times daily.  . meloxicam (MOBIC) 15 MG tablet Take 15 mg by mouth daily.  . methocarbamol (ROBAXIN) 500 MG tablet Take 500 mg by mouth every 8 (eight) hours as needed for muscle spasms.  . ondansetron (ZOFRAN) 4 MG tablet Take 1 tablet (4 mg total) by mouth every 8 (eight) hours as needed for nausea or vomiting.  Marland Kitchen oxybutynin (DITROPAN) 5 MG tablet Take 5 mg by mouth 2 (two) times daily.  Marland Kitchen oxybutynin (DITROPAN-XL) 10 MG 24 hr tablet Take 10 mg by mouth at bedtime.  Marland Kitchen oxyCODONE (OXY IR/ROXICODONE) 5 MG immediate release tablet Take 1 tablet (5 mg total) by mouth every 4 (four) hours as needed for severe pain.  . pravastatin (PRAVACHOL) 40 MG tablet Take 40 mg by mouth daily.   . pravastatin (PRAVACHOL) 80 MG tablet Take 80 mg by mouth daily.  . traZODone (DESYREL) 100 MG tablet Take 100 mg by mouth at bedtime.  . triamcinolone cream (KENALOG) 0.1 % Apply 1 application topically 2 (two) times daily.  . valACYclovir (VALTREX) 500 MG tablet Take 500 mg by mouth daily as needed.   . zolpidem (AMBIEN) 10 MG tablet Take 10 mg by mouth at bedtime as needed for  sleep.   No facility-administered encounter medications on file as of 12/12/2019.   1. Type 2 diabetes mellitus without complication, without long-term current use of insulin (H[facility]) A1c 9.9.  Patient education given on Mediterranean style diet, how to calculate added sugars, continue physical activity.  Encouraged patient to check blood sugars at home on a daily basis, patient refuses prescription for blood sugar meter.  Patient's diabetes is managed by her primary care provider - HgB A1c   I have reviewed the patient's medical history (PMH, PSH, Social History, Family History, Medications, and allergies) , and have been updated if relevant. I spent 30 minutes reviewing chart and  face to face time with patient.     Follow-up: Return if symptoms worsen or fail to improve.   Loraine Grip Mayers, PA-C

## 2019-12-12 NOTE — Patient Instructions (Signed)
I encourage you to keep your added sugars at 24 g and I encourage you to work on having added sugars just a few times a week.  I encourage you to work on having protein, fat and vegetables for breakfast and lunch, keeping your complex carbohydrate to a small portion along with your dinner.  Following a Mediterranean style diet can be the healthiest, can help reverse diabetes and help with overall health.  Please let us know if there is anything else we can do for you  Kennieth Rad, PA-C Physician Assistant [place] [email or web address]    Mediterranean Diet A Mediterranean diet refers to food and lifestyle choices that are based on the traditions of countries located on the [place]. This way of eating has been shown to help prevent certain conditions and improve outcomes for people who have chronic diseases, like kidney disease and heart disease. What are tips for following this plan? Lifestyle  Cook and eat meals together with your family, when possible.  Drink enough fluid to keep your urine clear or pale yellow.  Be physically active every day. This includes: ? Aerobic exercise like running or swimming. ? Leisure activities like gardening, walking, or housework.  Get 7-8 hours of sleep each night.  If recommended by your health care provider, drink red wine in moderation. This means 1 glass a day for nonpregnant women and 2 glasses a day for men. A glass of wine equals 5 oz (150 mL). Reading food labels   Check the serving size of packaged foods. For foods such as rice and pasta, the serving size refers to the amount of cooked product, not dry.  Check the total fat in packaged foods. Avoid foods that have saturated fat or trans fats.  Check the ingredients list for added sugars, such as corn syrup. Shopping  At the grocery store, buy most of your food from the areas near the walls of the store. This  includes: ? Fresh fruits and vegetables (produce). ? Grains, beans, nuts, and seeds. Some of these may be available in unpackaged forms or large amounts (in bulk). ? Fresh seafood. ? Poultry and eggs. ? Low-fat dairy products.  Buy whole ingredients instead of prepackaged foods.  Buy fresh fruits and vegetables in-season from local farmers markets.  Buy frozen fruits and vegetables in resealable bags.  If you do not have access to quality fresh seafood, buy precooked frozen shrimp or canned fish, such as tuna, salmon, or sardines.  Buy small amounts of raw or cooked vegetables, salads, or olives from the deli or salad bar at your store.  Stock your pantry so you always have certain foods on hand, such as olive oil, canned tuna, canned tomatoes, rice, pasta, and beans. Cooking  Cook foods with extra-virgin olive oil instead of using butter or other vegetable oils.  Have meat as a side dish, and have vegetables or grains as your main dish. This means having meat in small portions or adding small amounts of meat to foods like pasta or stew.  Use beans or vegetables instead of meat in common dishes like chili or lasagna.  Experiment with different cooking methods. Try roasting or broiling vegetables instead of steaming or sauteing them.  Add frozen vegetables to soups, stews, pasta, or rice.  Add nuts or seeds for added healthy fat at each meal. You can add these to yogurt, salads, or vegetable dishes.  Marinate fish or vegetables using olive oil, lemon juice, garlic, and fresh  herbs. Meal planning   Plan to eat 1 vegetarian meal one day each week. Try to work up to 2 vegetarian meals, if possible.  Eat seafood 2 or more times a week.  Have healthy snacks readily available, such as: ? Vegetable sticks with hummus. ? [place] yogurt. ? Fruit and nut trail mix.  Eat balanced meals throughout the week. This includes: ? Fruit: 2-3 servings a day ? Vegetables: 4-5 servings a  day ? Low-fat dairy: 2 servings a day ? Fish, poultry, or lean meat: 1 serving a day ? Beans and legumes: 2 or more servings a week ? Nuts and seeds: 1-2 servings a day ? Whole grains: 6-8 servings a day ? Extra-virgin olive oil: 3-4 servings a day  Limit red meat and sweets to only a few servings a month What are my food choices?  Mediterranean diet ? Recommended  Grains: Whole-grain pasta. Brown rice. Bulgar wheat. Polenta. Couscous. Whole-wheat bread. [place].  Vegetables: Artichokes. Beets. Broccoli. Cabbage. Carrots. Eggplant. Green beans. Chard. Kale. Spinach. Onions. Leeks. Peas. Squash. Tomatoes. Peppers. Radishes.  Fruits: Apples. Apricots. Avocado. Berries. Bananas. Cherries. Dates. Figs. Grapes. Lemons. Melon. Oranges. Peaches. Plums. Pomegranate.  Meats and other protein foods: Beans. Almonds. Sunflower seeds. Pine nuts. Peanuts. [place]. Salmon. Scallops. Shrimp. [place]. Tilapia. Clams. Oysters. Eggs.  Dairy: Low-fat milk. Cheese. Greek yogurt.  Beverages: Water. Red wine. Herbal tea.  Fats and oils: Extra virgin olive oil. Avocado oil. Grape seed oil.  Sweets and desserts: [place] yogurt with honey. Baked apples. Poached pears. Trail mix.  Seasoning and other foods: Basil. Cilantro. Coriander. Cumin. Mint. Parsley. Sage. Rosemary. Tarragon. Garlic. Oregano. Thyme. Pepper. Balsalmic vinegar. Tahini. Hummus. Tomato sauce. Olives. Mushrooms. ? Limit these  Grains: Prepackaged pasta or rice dishes. Prepackaged cereal with added sugar.  Vegetables: Deep fried potatoes (french fries).  Fruits: Fruit canned in syrup.  Meats and other protein foods: Beef. Pork. Lamb. Poultry with skin. Hot dogs. [place] Salines.  Dairy: Ice cream. Sour cream. Whole milk.  Beverages: Juice. Sugar-sweetened soft drinks. Beer. Liquor and spirits.  Fats and oils: Butter. Canola oil. Vegetable oil. Beef fat (tallow). Lard.  Sweets and desserts: Cookies. Cakes. Pies. Candy.  Seasoning and other  foods: Mayonnaise. Premade sauces and marinades. The items listed may not be a complete list. Talk with your dietitian about what dietary choices are right for you. Summary  The Mediterranean diet includes both food and lifestyle choices.  Eat a variety of fresh fruits and vegetables, beans, nuts, seeds, and whole grains.  Limit the amount of red meat and sweets that you eat.  Talk with your health care provider about whether it is safe for you to drink red wine in moderation. This means 1 glass a day for nonpregnant women and 2 glasses a day for men. A glass of wine equals 5 oz (150 mL). This information is not intended to replace advice given to you by your health care provider. Make sure you discuss any questions you have with your health care provider. Document Revised: 01/01/2016 Document Reviewed: 12/25/2015 Elsevier Patient Education  [place].

## 2020-02-29 ENCOUNTER — Other Ambulatory Visit: Payer: Medicare Other

## 2020-06-18 ENCOUNTER — Other Ambulatory Visit: Payer: Self-pay | Admitting: Family Medicine

## 2020-06-18 DIAGNOSIS — Z1231 Encounter for screening mammogram for malignant neoplasm of breast: Secondary | ICD-10-CM

## 2020-07-10 ENCOUNTER — Ambulatory Visit
Admission: RE | Admit: 2020-07-10 | Discharge: 2020-07-10 | Disposition: A | Discharge status: Home / Self Care | Payer: Medicare Other | Source: Ambulatory Visit | Attending: Family Medicine | Admitting: Family Medicine

## 2020-07-10 ENCOUNTER — Other Ambulatory Visit: Payer: Self-pay

## 2020-07-10 DIAGNOSIS — Z1231 Encounter for screening mammogram for malignant neoplasm of breast: Secondary | ICD-10-CM | POA: Insufficient documentation

## 2020-12-30 ENCOUNTER — Other Ambulatory Visit: Payer: Self-pay | Admitting: Podiatry

## 2021-01-06 ENCOUNTER — Encounter: Payer: Self-pay | Admitting: Podiatry

## 2021-01-16 NOTE — Discharge Instructions (Addendum)
[facility]  POST OPERATIVE INSTRUCTIONS FOR DR. FOWLER AND [facility] PODIAT[facility]PART[facility]   Take your medication as prescribed.  Pain medication should be taken only as needed.  Keep the dressing clean, dry and intact.  Keep your foot elevated above the heart level for the first 48 hours.  Walking to the bathroom and brief periods of walking are acceptable, unless we have instructed you to be non-weight bearing.  Always wear your post-op shoe when walking.  Always use your crutches if you are to be non-weight bearing.  Do not take a shower. Baths are permissible as long as the foot is kept out of the water.   Every hour you are awake:  Bend your knee 15 times. Flex foot 15 times Massage calf 15 times  Call [facility] ([phone]) if any of the following problems occur: You develop a temperature or fever. The bandage becomes saturated with blood. Medication does not stop your pain. Injury of the foot occurs. Any symptoms of infection including redness, odor, or red streaks running from wound.  Information for Discharge Teaching: EXPAREL (bupivacaine liposome injectable suspension)   Your surgeon or anesthesiologist gave you EXPAREL(bupivacaine) to help control your pain after surgery.  EXPAREL is a local anesthetic that provides pain relief by numbing the tissue around the surgical site. EXPAREL is designed to release pain medication over time and can control pain for up to 72 hours. Depending on how you respond to EXPAREL, you may require less pain medication during your recovery.  Possible side effects: Temporary loss of sensation or ability to move in the area where bupivacaine was injected. Nausea, vomiting, constipation Rarely, numbness and tingling in your mouth or lips, lightheadedness, or anxiety may occur. Call your doctor right away if you think you may be experiencing any of these sensations, or if  you have other questions regarding possible side effects.  Follow all other discharge instructions given to you by your surgeon or nurse. Eat a healthy diet and drink plenty of water or other fluids.  If you return to the hospital for any reason within 96 hours following the administration of EXPAREL, it is important for health care providers to know that you have received this anesthetic. A teal colored band has been placed on your arm with the date, time and amount of EXPAREL you have received in order to alert and inform your health care providers. Please leave this armband in place for the full 96 hours following administration, and then you may remove the band.   

## 2021-01-30 ENCOUNTER — Encounter: Payer: Self-pay | Admitting: Podiatry

## 2021-01-30 ENCOUNTER — Ambulatory Visit: Payer: Medicare Other | Admitting: Anesthesiology

## 2021-01-30 ENCOUNTER — Ambulatory Visit
Admission: RE | Admit: 2021-01-30 | Discharge: 2021-01-30 | Disposition: A | Discharge status: Home / Self Care | Payer: Medicare Other | Attending: Podiatry | Admitting: Podiatry

## 2021-01-30 ENCOUNTER — Other Ambulatory Visit: Payer: Self-pay

## 2021-01-30 ENCOUNTER — Ambulatory Visit
Admission: RE | Disposition: A | Discharge status: Home / Self Care | Payer: Self-pay | Source: Home / Self Care | Attending: Podiatry

## 2021-01-30 DIAGNOSIS — M2011 Hallux valgus (acquired), right foot: Secondary | ICD-10-CM | POA: Diagnosis present

## 2021-01-30 DIAGNOSIS — Z79899 Other long term (current) drug therapy: Secondary | ICD-10-CM | POA: Insufficient documentation

## 2021-01-30 DIAGNOSIS — Z87891 Personal history of nicotine dependence: Secondary | ICD-10-CM | POA: Diagnosis not present

## 2021-01-30 DIAGNOSIS — B351 Tinea unguium: Secondary | ICD-10-CM | POA: Diagnosis not present

## 2021-01-30 DIAGNOSIS — E119 Type 2 diabetes mellitus without complications: Secondary | ICD-10-CM | POA: Insufficient documentation

## 2021-01-30 DIAGNOSIS — L600 Ingrowing nail: Secondary | ICD-10-CM | POA: Insufficient documentation

## 2021-01-30 HISTORY — DX: Unspecified glaucoma: H40.9

## 2021-01-30 HISTORY — DX: Type 2 diabetes mellitus without complications: E11.9

## 2021-01-30 HISTORY — PX: BUNIONECTOMY: SHX129

## 2021-01-30 HISTORY — PX: WEIL OSTEOTOMY: SHX5044

## 2021-01-30 LAB — GLUCOSE, CAPILLARY
Glucose-Capillary: 107 mg/dL — ABNORMAL HIGH (ref 70–99)
Glucose-Capillary: 142 mg/dL — ABNORMAL HIGH (ref 70–99)

## 2021-01-30 SURGERY — BUNIONECTOMY
Anesthesia: General | Site: Toe | Laterality: Right

## 2021-01-30 MED ORDER — ONDANSETRON HCL 4 MG/2ML IJ SOLN: 4.0000 mg | Freq: Once | INTRAMUSCULAR | Status: DC | PRN

## 2021-01-30 MED ORDER — LACTATED RINGERS IV SOLN: INTRAVENOUS | Status: DC

## 2021-01-30 MED ORDER — SODIUM CHLORIDE 0.9 % IR SOLN
Status: DC | PRN
  Administered 2021-01-30: 1000 mL

## 2021-01-30 MED ORDER — OXYCODONE HCL 5 MG/5ML PO SOLN: 5.0000 mg | Freq: Once | ORAL | Status: AC | PRN

## 2021-01-30 MED ORDER — PROPOFOL 10 MG/ML IV BOLUS
INTRAVENOUS | Status: DC | PRN
  Administered 2021-01-30: 110 mg via INTRAVENOUS

## 2021-01-30 MED ORDER — ONDANSETRON HCL 4 MG/2ML IJ SOLN
INTRAMUSCULAR | Status: DC | PRN
  Administered 2021-01-30: 4 mg via INTRAVENOUS

## 2021-01-30 MED ORDER — OXYCODONE-ACETAMINOPHEN 5-325 MG PO TABS: 1.0000 | ORAL_TABLET | Freq: Four times a day (QID) | ORAL | 0 refills | Status: DC | PRN

## 2021-01-30 MED ORDER — OXYCODONE HCL 5 MG PO TABS
5.0000 mg | ORAL_TABLET | Freq: Once | ORAL | Status: AC | PRN
Start: 2021-01-30 — End: 2021-01-30
  Administered 2021-01-30: 5 mg via ORAL

## 2021-01-30 MED ORDER — BUPIVACAINE LIPOSOME 1.3 % IJ SUSP
INTRAMUSCULAR | Status: DC | PRN
  Administered 2021-01-30 (×2): 10 mL

## 2021-01-30 MED ORDER — MIDAZOLAM HCL 5 MG/5ML IJ SOLN
INTRAMUSCULAR | Status: DC | PRN
  Administered 2021-01-30: 1 mg via INTRAVENOUS

## 2021-01-30 MED ORDER — CEFAZOLIN SODIUM-DEXTROSE 2-4 GM/100ML-% IV SOLN
2.0000 g | INTRAVENOUS | Status: AC
  Administered 2021-01-30: 2 g via INTRAVENOUS

## 2021-01-30 MED ORDER — GLYCOPYRROLATE 0.2 MG/ML IJ SOLN
INTRAMUSCULAR | Status: DC | PRN
  Administered 2021-01-30: .1 mg via INTRAVENOUS

## 2021-01-30 MED ORDER — BUPIVACAINE HCL (PF) 0.25 % IJ SOLN
INTRAMUSCULAR | Status: DC | PRN
  Administered 2021-01-30: 10 mL via INTRA_ARTICULAR

## 2021-01-30 MED ORDER — POVIDONE-IODINE 7.5 % EX SOLN: Freq: Once | CUTANEOUS | Status: AC

## 2021-01-30 MED ORDER — FENTANYL CITRATE (PF) 100 MCG/2ML IJ SOLN
INTRAMUSCULAR | Status: DC | PRN
  Administered 2021-01-30: 50 ug via INTRAVENOUS

## 2021-01-30 MED ORDER — DEXAMETHASONE SODIUM PHOSPHATE 4 MG/ML IJ SOLN
INTRAMUSCULAR | Status: DC | PRN
  Administered 2021-01-30: 4 mg via INTRAVENOUS

## 2021-01-30 MED ORDER — EPHEDRINE SULFATE 50 MG/ML IJ SOLN
INTRAMUSCULAR | Status: DC | PRN
  Administered 2021-01-30 (×2): 5 mg via INTRAVENOUS
  Administered 2021-01-30: 10 mg via INTRAVENOUS
  Administered 2021-01-30: 5 mg via INTRAVENOUS

## 2021-01-30 MED ORDER — FENTANYL CITRATE PF 50 MCG/ML IJ SOSY: 25.0000 ug | PREFILLED_SYRINGE | INTRAMUSCULAR | Status: DC | PRN

## 2021-01-30 MED ORDER — LIDOCAINE HCL (CARDIAC) PF 100 MG/5ML IV SOSY
PREFILLED_SYRINGE | INTRAVENOUS | Status: DC | PRN
  Administered 2021-01-30: 50 mg via INTRATRACHEAL

## 2021-01-30 SURGICAL SUPPLY — 43 items
APL SKNCLS STERI-STRIP NONHPOA (GAUZE/BANDAGES/DRESSINGS) ×2
BENZOIN TINCTURE PRP APPL 2/3 (GAUZE/BANDAGES/DRESSINGS) ×3 IMPLANT
BLADE SAW LAPIPLASTY 40X11 (BLADE) ×3 IMPLANT
BLADE SURG 15 STRL LF DISP TIS (BLADE) ×4 IMPLANT
BLADE SURG 15 STRL SS (BLADE) ×6
BNDG CMPR 75X41 PLY HI ABS (GAUZE/BANDAGES/DRESSINGS) ×2
BNDG ELASTIC 4X5.8 VLCR STR LF (GAUZE/BANDAGES/DRESSINGS) ×3 IMPLANT
BNDG ESMARK 4X12 TAN STRL LF (GAUZE/BANDAGES/DRESSINGS) ×3 IMPLANT
BNDG STRETCH 4X75 STRL LF (GAUZE/BANDAGES/DRESSINGS) ×3 IMPLANT
BOOT STEPPER DURA MED (SOFTGOODS) ×3 IMPLANT
CANISTER SUCT 1200ML W/VALVE (MISCELLANEOUS) ×3 IMPLANT
COVER LIGHT HANDLE UNIVERSAL (MISCELLANEOUS) ×6 IMPLANT
CUFF TOURN SGL QUICK 18X4 (TOURNIQUET CUFF) IMPLANT
DRAPE FLUOR MINI C-ARM 54X84 (DRAPES) ×3 IMPLANT
DURAPREP 26ML APPLICATOR (WOUND CARE) ×6 IMPLANT
ELECT REM PT RETURN 9FT ADLT (ELECTROSURGICAL) ×3
ELECTRODE REM PT RTRN 9FT ADLT (ELECTROSURGICAL) ×2 IMPLANT
GAUZE 4X4 16PLY ~~LOC~~+RFID DBL (SPONGE) ×3 IMPLANT
GAUZE SPONGE 4X4 12PLY STRL (GAUZE/BANDAGES/DRESSINGS) ×3 IMPLANT
GAUZE XEROFORM 1X8 LF (GAUZE/BANDAGES/DRESSINGS) ×3 IMPLANT
GLOVE SRG 8 PF TXTR STRL LF DI (GLOVE) ×2 IMPLANT
GLOVE SURG ENC MOIS LTX SZ7.5 (GLOVE) ×3 IMPLANT
GLOVE SURG UNDER POLY LF SZ8 (GLOVE) ×3
GOWN STRL REUS W/ TWL LRG LVL3 (GOWN DISPOSABLE) ×4 IMPLANT
GOWN STRL REUS W/TWL LRG LVL3 (GOWN DISPOSABLE) ×6
KIT TURNOVER KIT A (KITS) ×3 IMPLANT
LAPIPLASTY SYS 4A (Orthopedic Implant) ×3 IMPLANT
NS IRRIG 500ML POUR BTL (IV SOLUTION) ×3 IMPLANT
PACK EXTREMITY ARMC (MISCELLANEOUS) ×3 IMPLANT
PENCIL SMOKE EVACUATOR (MISCELLANEOUS) ×3 IMPLANT
RASP SM TEAR CROSS CUT (RASP) ×3 IMPLANT
SCREW 2.7 HIGH PITCH LOCKING (Screw) ×3 IMPLANT
SCREW HIGH PITCH LOCK 2.7 (Screw) ×3 IMPLANT
STOCKINETTE IMPERVIOUS LG (DRAPES) ×3 IMPLANT
STRIP CLOSURE SKIN 1/4X4 (GAUZE/BANDAGES/DRESSINGS) ×3 IMPLANT
SUT ETHILON 3-0 (SUTURE) ×3 IMPLANT
SUT MNCRL+ 5-0 UNDYED PC-3 (SUTURE) ×2 IMPLANT
SUT MONOCRYL 5-0 (SUTURE) ×3
SUT VIC AB 3-0 SH 27 (SUTURE) ×3
SUT VIC AB 3-0 SH 27X BRD (SUTURE) ×2 IMPLANT
SUT VIC AB 4-0 SH 27 (SUTURE) ×3
SUT VIC AB 4-0 SH 27XANBCTRL (SUTURE) ×2 IMPLANT
SYSTEM LAPIPLASTY 4A (Orthopedic Implant) ×2 IMPLANT

## 2021-01-30 NOTE — Transfer of Care (Signed)
Immediate Anesthesia Transfer of Care Note  Patient: Desiree Oneal  Procedure(s) Performed: Lillard Anes; LAPIDUS-TYPE (Right: Toe) PHALANX OSTEOTOMY-AKIN (Right: Foot)  Patient Location: PACU  Anesthesia Type: General  Level of Consciousness: awake, alert  and patient cooperative  Airway and Oxygen Therapy: Patient Spontanous Breathing and Patient connected to supplemental oxygen  Post-op Assessment: Post-op Vital signs reviewed, Patient's Cardiovascular Status Stable, Respiratory Function Stable, Patent Airway and No signs of Nausea or vomiting  Post-op Vital Signs: Reviewed and stable  Complications: No notable events documented.

## 2021-01-30 NOTE — Op Note (Signed)
Operative note   Surgeon:Evaleigh Mccamy Lawyer: None    Preop diagnosis: Hallux valgus deformity right foot foot    Postop diagnosis: Same    Procedure: Lapidus hallux valgus correction right foot    EBL: Minimal    Anesthesia:local and general.  Local consisted of a total of 10 cc of 0.25% bupivacaine plain and 20 cc of Exparel long-acting anesthetic    Hemostasis: Ankle tourniquet inflated to 200 mmHg for 83 minutes    Specimen: None    Complications: None    Operative indications:BLIMIE VANESS is an 69 y.o. that presents today for surgical intervention.  The risks/benefits/alternatives/complications have been discussed and consent has been given.    Procedure:  Patient was brought into the OR and placed on the operating table in thesupine position. After anesthesia was obtained theright lower extremity was prepped and draped in usual sterile fashion.  Attention was directed to the dorsal aspect of the foot where a dorsal incision was made at the first met cuneiforms joint.  Sharp and blunt dissection was carried down to the periosteum.  Subperiosteal dissection was then undertaken.  This exposed the first met cuneiform joint.  This was then freed and loosened.  A small fulcrum was placed between the base of the first metatarsal and second metatarsal.  Next the joint positioner was placed on the medial aspect of the metatarsal.  A small stab incision was made at the second metatarsal.  Compression was placed for the positioner.  Good realignment of the first intermetatarsal angle was noted at this time.  Attention was then directed to the dorsomedial first MTPJ where an incision was performed.  Sharp and blunt dissection was carried down to the capsule.  The intermetatarsal space was then entered.  The conjoined tendon of the abductor was then freed from the base of the proximal phalanx.  Attention was to redirected to the first met cuneiform joint.  At this time the  osteotomy cut guide was placed into the joint region.  2 vertical cuts were then made.  The cartilage material was removed from the first met cuneiform joint and the joint was then prepped with a 2.0 mm drill bit.  The joint compressor was then placed.  Good compression and realignment was noted.  Next the medial and dorsal locking plates were then placed from the [place] set.  Realignment and stability was noted in all planes.  Attention was redirected to the dorsomedial first MTPJ.  A small T capsulotomy was performed.  The dorsomedial eminence was noted and transected and smoothed with a power rasp.  A small capsulorrhaphy was performed.  Closure was then performed after all areas were irrigated.  3-0 Vicryl for the capsular tissue.  4-0 Vicryl in subcutaneous tissue and a 4-0 Monocryl for the skin.   Further local anesthesia was performed at the end of the case.    Patient tolerated the procedure and anesthesia well.  Was transported from the OR to the PACU with all vital signs stable and vascular status intact. To be discharged per routine protocol.  Will follow up in approximately 1 week in the outpatient clinic.

## 2021-01-30 NOTE — Anesthesia Procedure Notes (Signed)
Procedure Name: LMA Insertion Date/Time: 01/30/2021 9:50 AM Performed by: Mayme Genta, CRNA Pre-anesthesia Checklist: Patient identified, Emergency Drugs available, Suction available, Timeout performed and Patient being monitored Patient Re-evaluated:Patient Re-evaluated prior to induction Oxygen Delivery Method: Circle system utilized Preoxygenation: Pre-oxygenation with 100% oxygen Induction Type: IV induction LMA: LMA inserted LMA Size: 4.0 Number of attempts: 1 Placement Confirmation: positive ETCO2 and breath sounds checked- equal and bilateral Tube secured with: Tape

## 2021-01-30 NOTE — Anesthesia Postprocedure Evaluation (Signed)
Anesthesia Post Note  Patient: Desiree Oneal  Procedure(s) Performed: Lillard Anes LAPIDUS-TYPE (Right: Toe) PHALANX OSTEOTOMY-AKIN (Right: Foot)     Patient location during evaluation: PACU Anesthesia Type: General Level of consciousness: awake and alert Pain management: pain level controlled Vital Signs Assessment: vitals unstable and post-procedure vital signs reviewed and stable Respiratory status: nonlabored ventilation and spontaneous breathing Cardiovascular status: blood pressure returned to baseline Postop Assessment: no apparent nausea or vomiting Anesthetic complications: no   No notable events documented.  [name] [employer]

## 2021-01-30 NOTE — H&P (Signed)
HISTORY AND PHYSICAL INTERVAL NOTE:  01/30/2021  9:28 AM  Delano Metz  has presented today for surgery, with the diagnosis of M20.11- Hallux valgus of right foot E11.9- Diabetes mellitus without complication.  The various methods of treatment have been discussed with the patient.  No guarantees were given.  After consideration of risks, benefits and other options for treatment, the patient has consented to surgery.  I have reviewed the patients' chart and labs.     A history and physical examination was performed in my office.  The patient was reexamined.  There have been no changes to this history and physical examination.  Samara Deist A

## 2021-01-30 NOTE — Anesthesia Preprocedure Evaluation (Signed)
Anesthesia Evaluation  Patient identified by MRN, date of birth, ID band Patient awake    Reviewed: Allergy & Precautions, NPO status , Patient's Chart, lab work & pertinent test results  Airway Mallampati: II  TM Distance: >3 FB Neck ROM: Full    Dental no notable dental hx.    Pulmonary former smoker,    Pulmonary exam normal        Cardiovascular hypertension, Pt. on medications Normal cardiovascular exam     Neuro/Psych  Neuromuscular disease (Trigeminal neuralgia) negative psych ROS   GI/Hepatic negative GI ROS, Neg liver ROS,   Endo/Other  diabetes, Type 2BMI 32  Renal/GU      Musculoskeletal  (+) Arthritis , Hallux valgus of right foot   Abdominal (+) + obese,   Peds  Hematology   Anesthesia Other Findings   Reproductive/Obstetrics                             Anesthesia Physical Anesthesia Plan  ASA: 3  Anesthesia Plan: General   Post-op Pain Management:    Induction: Intravenous  PONV Risk Score and Plan: 3 and Ondansetron, Dexamethasone, Treatment may vary due to age or medical condition and Midazolam  Airway Management Planned: LMA  Additional Equipment:   Intra-op Plan:   Post-operative Plan:   Informed Consent: I have reviewed the patients History and Physical, chart, labs and discussed the procedure including the risks, benefits and alternatives for the proposed anesthesia with the patient or authorized representative who has indicated his/her understanding and acceptance.     Dental advisory given  Plan Discussed with: CRNA  Anesthesia Plan Comments:         Anesthesia Quick Evaluation

## 2021-02-02 ENCOUNTER — Encounter: Payer: Self-pay | Admitting: Podiatry

## 2021-06-10 ENCOUNTER — Other Ambulatory Visit: Payer: Self-pay | Admitting: Family Medicine

## 2021-06-10 DIAGNOSIS — Z1231 Encounter for screening mammogram for malignant neoplasm of breast: Secondary | ICD-10-CM

## 2021-07-14 ENCOUNTER — Other Ambulatory Visit: Payer: Self-pay

## 2021-07-14 ENCOUNTER — Ambulatory Visit
Admission: RE | Admit: 2021-07-14 | Discharge: 2021-07-14 | Disposition: A | Discharge status: Home / Self Care | Payer: Medicare Other | Source: Ambulatory Visit | Attending: Family Medicine | Admitting: Family Medicine

## 2021-07-14 DIAGNOSIS — Z1231 Encounter for screening mammogram for malignant neoplasm of breast: Secondary | ICD-10-CM | POA: Insufficient documentation

## 2021-09-29 ENCOUNTER — Other Ambulatory Visit: Payer: Self-pay

## 2021-09-29 NOTE — Patient Outreach (Signed)
Aging Gracefully Program ? ?09/29/2021 ? ?Desiree Oneal ?January 23, 1951 ?[id number] ? ?[facility] Evaluation Interviewer made contact with patient. Aging Gracefully initial survey completed.  ? ?Interviewer will send referral to RN and OT for follow up. ? ?Ina Homes ?Care Management Assistant  ?[phone] ? ?

## 2021-10-12 ENCOUNTER — Other Ambulatory Visit: Payer: Self-pay | Admitting: Orthopedic Surgery

## 2021-10-13 ENCOUNTER — Other Ambulatory Visit: Payer: Self-pay

## 2021-10-13 ENCOUNTER — Encounter
Admission: RE | Admit: 2021-10-13 | Discharge: 2021-10-13 | Disposition: A | Discharge status: Home / Self Care | Payer: Medicare Other | Source: Ambulatory Visit | Attending: Orthopedic Surgery | Admitting: Orthopedic Surgery

## 2021-10-13 VITALS — Ht 64.0 in | Wt 190.0 lb

## 2021-10-13 DIAGNOSIS — E119 Type 2 diabetes mellitus without complications: Secondary | ICD-10-CM | POA: Diagnosis not present

## 2021-10-13 DIAGNOSIS — Z01818 Encounter for other preprocedural examination: Secondary | ICD-10-CM | POA: Diagnosis present

## 2021-10-13 DIAGNOSIS — I10 Essential (primary) hypertension: Secondary | ICD-10-CM | POA: Diagnosis not present

## 2021-10-13 NOTE — Patient Instructions (Addendum)
Your procedure is scheduled on: Tuesday 10/27/21 Report to the Registration Desk on the 1st floor of the [facility]. To find out your arrival time, please call [phone] between 1PM - 3PM on: Monday 10/26/21 If your arrival time is 6:00 am, do not arrive prior to that time as the [facility] entrance doors do not open until 6:00 am.  REMEMBER: Instructions that are not followed completely may result in serious medical risk, up to and including death; or upon the discretion of your surgeon and anesthesiologist your surgery may need to be rescheduled.  Do not eat or drink after midnight the night before surgery.  No gum chewing, lozengers or hard candies.  TAKE THESE MEDICATIONS THE MORNING OF SURGERY WITH A SIP OF WATER: amLODipine (NORVASC) 5 MG tablet carbamazepine (TEGRETOL) 200 MG tablet celecoxib (CELEBREX) 200 MG capsule  Hold metFORMIN (GLUCOPHAGE) 500 MG tablet for 2 days prior to surgery. Last dose on Saturday 10/24/21.  Hold your Aspirin 81 mg for week prior to surgery. Last dose on Monday 10/19/21.  One week prior to surgery: Stop Anti-inflammatories (NSAIDS) such as Advil, Aleve, Ibuprofen, Motrin, Naproxen, Naprosyn and Aspirin based products such as Excedrin, Goodys Powder, BC Powder.  Stop ANY OVER THE COUNTER supplements until after surgery.  You may however, continue to take Tylenol if needed for pain up until the day of surgery.  No Alcohol for 24 hours before or after surgery.  No Smoking including e-cigarettes for 24 hours prior to surgery.  No chewable tobacco products for at least 6 hours prior to surgery.  No nicotine patches on the day of surgery.  Do not use any "recreational" drugs for at least a week prior to your surgery.  Please be advised that the combination of cocaine and anesthesia may have negative outcomes, up to and including death. If you test positive for cocaine, your surgery will be cancelled.  On the morning of surgery brush your teeth  with toothpaste and water, you may rinse your mouth with mouthwash if you wish. Do not swallow any toothpaste or mouthwash.  Use CHG wipes as directed on instruction sheet.  Do not wear jewelry, make-up, hairpins, clips or nail polish.  Do not wear lotions, powders, or perfumes.   Do not shave body from the neck down 48 hours prior to surgery just in case you cut yourself which could leave a site for infection.  Also, freshly shaved skin may become irritated if using the CHG soap.  Do not bring valuables to the hospital. [facility] is not responsible for any missing/lost belongings or valuables.   Notify your doctor if there is any change in your medical condition (cold, fever, infection).  Wear comfortable clothing (specific to your surgery type) to the hospital.  After surgery, you can help prevent lung complications by doing breathing exercises.  Take deep breaths and cough every 1-2 hours.   If you are being discharged the day of surgery, you will not be allowed to drive home. You will need a responsible adult (18 years or older) to drive you home and stay with you that night.   If you are taking public transportation, you will need to have a responsible adult (18 years or older) with you. Please confirm with your physician that it is acceptable to use public transportation.   Please call the [place] [place] Dept. at [phone] if you have any questions about these instructions.  Surgery Visitation Policy:  Patients undergoing a surgery or procedure  may have two family members or support persons with them as long as the person is not COVID-19 positive or experiencing its symptoms.   Inpatient Visitation:    Visiting hours are 7 a.m. to 8 p.m. Up to four visitors are allowed at one time in a patient room, including children. The visitors may rotate out with other people during the day. One designated support person (adult) may remain overnight.

## 2021-10-14 ENCOUNTER — Other Ambulatory Visit: Payer: Self-pay | Admitting: Occupational Therapy

## 2021-10-14 NOTE — Patient Instructions (Signed)
Goals Addressed             This Visit's Progress    Patient Stated       She would like to feel safer in her bathrooms.  Guest bath -tub to walk in shower conversion with hand held shower head, and 2 grab bars (one on inside long wall and on outside of tub on the right as you face the tub), Toilet handle frame on comfort height toilet due to no walls to put grab bar(s) on. Master bath--hand held shower head, grab bars (one on inside long wall and one on outside of tub on left hand side as you face tub. Grab bars on each side of toilet. 1/2 bath--grab bar on right hand side of toilet as you sit on it.     Patient Stated       She would like to feel more safe getting in and out of her house. Considering a ramp at the carport to the far left side as you face the house. Only big enough to fit a wheelchair so she can still get 2 cars under the [place].     Patient Stated       She would like for bathing to me easier (long handled sponge will help this). She already has shower seat when needs to use it.     Patient Stated       She would like to know more about sleep strategies.

## 2021-10-14 NOTE — Patient Outreach (Signed)
Aging Gracefully Program  OT Initial Visit  10/14/2021  MELEANE Oneal 28-Nov-1950 [id number]  Visit:  1- Initial Visit  Start Time:  1330 End Time:  3267 Total Minutes:  120  CCAP: Typical Daily Routine: Typical Daily Routine:: Dogs get her up around 7:30, she gets ready and goes to exercise class 4 mornings a week. When she gets back she fixes herself something to eat and then does what needs to be done around the house, run errands or goes to one of her social groups if they are sccheduled for that day. What Types Of Care Problems Are You Having Throughout The Day?: Painful left shoulder that doesn't allow her to sleep in her bed (she sleeps in her recliner); bone on bone knees that she currenlty treats with doctor giving her shots every so often What Kind Of Help Do You Receive?: none Do You Think You Need Other Types Of Help?: A few home modifications would help What Do You Think Would Make Everyday Life Easier For You?: walk in shower, grab bars in showers, grab bars around toilets Do You Have Time For Yourself?: yes Patient Reported Equipment: Patient Reported Equipment Currently Used:  (none) Other Equipment:: shower seat with back Functional Mobility-Maintain Balance While Showering: Maintaining Balance While Showering: Moderate Difficulty Do You:: No Device/No Assistance Importance Of Learning New Strategies:: Very Much Other Comments:: reports difficulty with washing lower legs and feet since she does not have any grab bars to hold on to. Safety: Moderate/Extreme Risk Efficiency: Not At All Intervention: Yes Other Comments:: one shower converted to walk in shower with grab bars and hand held shower. Other shower grab bars added. Functional Mobility-Stooping, Crouching, Kneeling To Retreive Item: Stooping, Crouching, or Kneeling To Retrieve Item: Moderate Difficulty Do You:: Use Personal Assistance Importance Of Learning New Strategies:: A Little Other Comments:: uses a  reacher at times and or really takes her time Functional Mobility-Reaching For Items Above Shoulder Level: Reaching For Items Above Shoulder Level: Moderate Difficulty Do You:: Use A Device Importance Of Learning New Strategies:: Very Much Other Comments:: repors she is really dedicated to continueing her exercises for right shoulder post her formalized therapy is complete to make sure her shoulder remains for functional for her post surgery. She was not aware of this when she had her left shoulder done so it does not work as well as she wished it would. Intervention: Yes (new reacher for in the house) Other Comments:: she has a reacher she can use (and does use it outside). the one inside is not a very good one. Functional Mobility-Move In And Out Of Bath/Shower: Move In And Out Of A Bath/Shower: Moderate Difficulty Do You:: No Device/No Assistance Importance Of Learning New Strategies:: Very Much Safety: Moderate/Extreme Risk Intervention: Yes Other Comments:: tub vconverted to walk in shower with hand held shower and grab bars, grab bars in other tub/shower as well. Functional Mobility-Get On And Off Toilet: Getting Up From The Floor: A Lot Of Difficulty Do You:: No Device/No Assistance Importance Of Learning New Strategies:: Very Much Efficiency: Not At All (she reports) Intervention: Yes Other Comments:: getting up from a fall handout and practicing  Readiness To Change Score:  Readiness to Change Score: 10  Home Environment Assessment: Outside Home Entry:: Usually goes in and out of the house under the carport where she has 3 steps (2 equal height and 1 quite a bit smaller; she also has rail to hold on to on the right as she goes up  the steps. Bathroom:: Master--tub/shower with old hand held shower and no grab bars, comfort height toilet no rails; Guest bath--tub.shower with standard shower head, no grab bars; comforth height toilet no grab bars. 1/2 bath: comfort height toilet no  grab bars, sink faucet leaks. Mailbox:: At street Other Home Environment Concerns:: Client is concerned about her insulation in her attic and under her house  Goals:  Goals Addressed             This Visit's Progress    Patient Stated       She would like to feel safer in her bathrooms.  Guest bath -tub to walk in shower conversion with hand held shower head, and 2 grab bars (one on inside long wall and on outside of tub on the right as you face the tub), Toilet handle frame on comfort height toilet due to no walls to put grab bar(s) on. Master bath--hand held shower head, grab bars (one on inside long wall and one on outside of tub on left hand side as you face tub. Grab bars on each side of toilet. 1/2 bath--grab bar on right hand side of toilet as you sit on it.     Patient Stated       She would like to feel more safe getting in and out of her house. Considering a ramp at the carport to the far left side as you face the house. Only big enough to fit a wheelchair so she can still get 2 cars under the [place].     Patient Stated       She would like for bathing to me easier (long handled sponge will help this). She already has shower seat when needs to use it.     Patient Stated       She would like to know more about sleep strategies.        Post Clinical Reasoning: Clinician View Of Client Situation:: Desiree Oneal stays quiet active. 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 She is able to take care of herself, take care of her house, and enjoys taking care of her yard and decorating outside for the different seasons. She has 2 dogs, 2 cats, and several chickens that she is also able to take care of. Her knees are bone on bone and she has two bad shoulders (left she has had surgery on and the right one is has  upcoming surgery)--despite this she stays active. [name]:: She feels she does really well for herself (and I AGREE). She is looking forward to getting her right shoulder surgery and knows it will not be the best to start with but is up to the challenge of follow up therapy and continuing own her own once therapy is over. She is looking forward to a trip to the [facility] with her dtr soon. Next Visit Plan:: Sleeping suggestions  [place]en Circle, OTR/L Acute Rehab Services Aging Gracefully [phone] Office [phone]

## 2021-10-27 ENCOUNTER — Other Ambulatory Visit: Payer: Self-pay

## 2021-10-27 ENCOUNTER — Ambulatory Visit: Payer: No Typology Code available for payment source | Admitting: Certified Registered Nurse Anesthetist

## 2021-10-27 ENCOUNTER — Ambulatory Visit
Admission: RE | Admit: 2021-10-27 | Discharge: 2021-10-27 | Disposition: A | Discharge status: Home / Self Care | Payer: No Typology Code available for payment source | Attending: Orthopedic Surgery | Admitting: Orthopedic Surgery

## 2021-10-27 ENCOUNTER — Encounter
Admission: RE | Disposition: A | Discharge status: Home / Self Care | Payer: Self-pay | Source: Home / Self Care | Attending: Orthopedic Surgery

## 2021-10-27 ENCOUNTER — Ambulatory Visit: Payer: No Typology Code available for payment source

## 2021-10-27 ENCOUNTER — Encounter: Payer: Self-pay | Admitting: Orthopedic Surgery

## 2021-10-27 DIAGNOSIS — I10 Essential (primary) hypertension: Secondary | ICD-10-CM | POA: Insufficient documentation

## 2021-10-27 DIAGNOSIS — S43431A Superior glenoid labrum lesion of right shoulder, initial encounter: Secondary | ICD-10-CM | POA: Insufficient documentation

## 2021-10-27 DIAGNOSIS — X58XXXA Exposure to other specified factors, initial encounter: Secondary | ICD-10-CM | POA: Diagnosis not present

## 2021-10-27 DIAGNOSIS — G500 Trigeminal neuralgia: Secondary | ICD-10-CM | POA: Insufficient documentation

## 2021-10-27 DIAGNOSIS — M2011 Hallux valgus (acquired), right foot: Secondary | ICD-10-CM | POA: Insufficient documentation

## 2021-10-27 DIAGNOSIS — Z6832 Body mass index (BMI) 32.0-32.9, adult: Secondary | ICD-10-CM | POA: Diagnosis not present

## 2021-10-27 DIAGNOSIS — M75111 Incomplete rotator cuff tear or rupture of right shoulder, not specified as traumatic: Secondary | ICD-10-CM | POA: Insufficient documentation

## 2021-10-27 DIAGNOSIS — Z7984 Long term (current) use of oral hypoglycemic drugs: Secondary | ICD-10-CM | POA: Insufficient documentation

## 2021-10-27 DIAGNOSIS — E669 Obesity, unspecified: Secondary | ICD-10-CM | POA: Insufficient documentation

## 2021-10-27 DIAGNOSIS — E119 Type 2 diabetes mellitus without complications: Secondary | ICD-10-CM | POA: Insufficient documentation

## 2021-10-27 HISTORY — PX: SHOULDER ARTHROSCOPY WITH OPEN ROTATOR CUFF REPAIR AND DISTAL CLAVICLE ACROMINECTOMY: SHX5683

## 2021-10-27 LAB — GLUCOSE, CAPILLARY
Glucose-Capillary: 106 mg/dL — ABNORMAL HIGH (ref 70–99)
Glucose-Capillary: 137 mg/dL — ABNORMAL HIGH (ref 70–99)

## 2021-10-27 SURGERY — SHOULDER ARTHROSCOPY WITH OPEN ROTATOR CUFF REPAIR AND DISTAL CLAVICLE ACROMINECTOMY
Anesthesia: General | Site: Shoulder | Laterality: Right

## 2021-10-27 MED ORDER — PROPOFOL 10 MG/ML IV BOLUS
INTRAVENOUS | Status: AC
  Filled 2021-10-27: qty 20

## 2021-10-27 MED ORDER — BUPIVACAINE LIPOSOME 1.3 % IJ SUSP
INTRAMUSCULAR | Status: DC | PRN
  Administered 2021-10-27: 10 mL via PERINEURAL

## 2021-10-27 MED ORDER — BUPIVACAINE HCL (PF) 0.25 % IJ SOLN
INTRAMUSCULAR | Status: AC
  Filled 2021-10-27: qty 30

## 2021-10-27 MED ORDER — CHLORHEXIDINE GLUCONATE 0.12 % MT SOLN
OROMUCOSAL | Status: AC
  Administered 2021-10-27: 15 mL via OROMUCOSAL
  Filled 2021-10-27: qty 15

## 2021-10-27 MED ORDER — LIDOCAINE HCL (PF) 2 % IJ SOLN
INTRAMUSCULAR | Status: AC
  Filled 2021-10-27: qty 5

## 2021-10-27 MED ORDER — BUPIVACAINE LIPOSOME 1.3 % IJ SUSP
INTRAMUSCULAR | Status: AC
  Filled 2021-10-27: qty 10

## 2021-10-27 MED ORDER — ACETAMINOPHEN 500 MG PO TABS: 1000.0000 mg | ORAL_TABLET | ORAL | Status: AC

## 2021-10-27 MED ORDER — CHLORHEXIDINE GLUCONATE CLOTH 2 % EX PADS: 6.0000 | MEDICATED_PAD | Freq: Once | CUTANEOUS | Status: DC

## 2021-10-27 MED ORDER — ORAL CARE MOUTH RINSE: 15.0000 mL | Freq: Once | OROMUCOSAL | Status: AC

## 2021-10-27 MED ORDER — OXYCODONE HCL 5 MG/5ML PO SOLN: 5.0000 mg | Freq: Once | ORAL | Status: DC | PRN

## 2021-10-27 MED ORDER — OXYCODONE HCL 5 MG PO TABS: 5.0000 mg | ORAL_TABLET | ORAL | 0 refills | Status: DC | PRN

## 2021-10-27 MED ORDER — RINGERS IRRIGATION IR SOLN
Status: DC | PRN
  Administered 2021-10-27 (×2): 6000 mL

## 2021-10-27 MED ORDER — DEXAMETHASONE SODIUM PHOSPHATE 10 MG/ML IJ SOLN
INTRAMUSCULAR | Status: AC
  Filled 2021-10-27: qty 1

## 2021-10-27 MED ORDER — CHLORHEXIDINE GLUCONATE 0.12 % MT SOLN: 15.0000 mL | Freq: Once | OROMUCOSAL | Status: AC

## 2021-10-27 MED ORDER — LIDOCAINE HCL (PF) 1 % IJ SOLN
INTRAMUSCULAR | Status: AC
Start: 2021-10-27 — End: ?
  Filled 2021-10-27: qty 30

## 2021-10-27 MED ORDER — DEXAMETHASONE SODIUM PHOSPHATE 10 MG/ML IJ SOLN
INTRAMUSCULAR | Status: DC | PRN
  Administered 2021-10-27: 5 mg via INTRAVENOUS

## 2021-10-27 MED ORDER — LIDOCAINE HCL (CARDIAC) PF 100 MG/5ML IV SOSY
PREFILLED_SYRINGE | INTRAVENOUS | Status: DC | PRN
  Administered 2021-10-27: 100 mg via INTRAVENOUS

## 2021-10-27 MED ORDER — ROCURONIUM BROMIDE 100 MG/10ML IV SOLN
INTRAVENOUS | Status: DC | PRN
  Administered 2021-10-27: 20 mg via INTRAVENOUS
  Administered 2021-10-27: 60 mg via INTRAVENOUS
  Administered 2021-10-27: 5 mg via INTRAVENOUS
  Administered 2021-10-27: 10 mg via INTRAVENOUS
  Administered 2021-10-27: 20 mg via INTRAVENOUS

## 2021-10-27 MED ORDER — ROCURONIUM BROMIDE 10 MG/ML (PF) SYRINGE
PREFILLED_SYRINGE | INTRAVENOUS | Status: AC
Start: 2021-10-27 — End: ?
  Filled 2021-10-27: qty 10

## 2021-10-27 MED ORDER — SODIUM CHLORIDE 0.9 % IV SOLN: INTRAVENOUS | Status: DC

## 2021-10-27 MED ORDER — ROCURONIUM BROMIDE 10 MG/ML (PF) SYRINGE
PREFILLED_SYRINGE | INTRAVENOUS | Status: AC
  Filled 2021-10-27: qty 10

## 2021-10-27 MED ORDER — ONDANSETRON HCL 4 MG/2ML IJ SOLN
INTRAMUSCULAR | Status: DC | PRN
  Administered 2021-10-27: 4 mg via INTRAVENOUS

## 2021-10-27 MED ORDER — PROMETHAZINE HCL 25 MG/ML IJ SOLN: 6.2500 mg | INTRAMUSCULAR | Status: DC | PRN

## 2021-10-27 MED ORDER — LACTATED RINGERS IV SOLN
INTRAVENOUS | Status: DC | PRN
  Administered 2021-10-27: 12004 mL

## 2021-10-27 MED ORDER — MIDAZOLAM HCL 2 MG/2ML IJ SOLN
INTRAMUSCULAR | Status: AC
  Filled 2021-10-27: qty 2

## 2021-10-27 MED ORDER — DROPERIDOL 2.5 MG/ML IJ SOLN: 0.6250 mg | Freq: Once | INTRAMUSCULAR | Status: DC | PRN

## 2021-10-27 MED ORDER — PHENYLEPHRINE HCL-NACL 20-0.9 MG/250ML-% IV SOLN
INTRAVENOUS | Status: AC
  Filled 2021-10-27: qty 250

## 2021-10-27 MED ORDER — CEFAZOLIN SODIUM-DEXTROSE 2-4 GM/100ML-% IV SOLN
INTRAVENOUS | Status: AC
  Filled 2021-10-27: qty 100

## 2021-10-27 MED ORDER — LIDOCAINE HCL (PF) 1 % IJ SOLN
INTRAMUSCULAR | Status: DC | PRN
  Administered 2021-10-27: 1 mL via SUBCUTANEOUS

## 2021-10-27 MED ORDER — EPINEPHRINE PF 1 MG/ML IJ SOLN
INTRAMUSCULAR | Status: AC
Start: 2021-10-27 — End: ?
  Filled 2021-10-27: qty 3

## 2021-10-27 MED ORDER — PROPOFOL 10 MG/ML IV BOLUS
INTRAVENOUS | Status: DC | PRN
  Administered 2021-10-27: 130 mg via INTRAVENOUS

## 2021-10-27 MED ORDER — SEVOFLURANE IN SOLN
RESPIRATORY_TRACT | Status: AC
  Filled 2021-10-27: qty 250

## 2021-10-27 MED ORDER — FENTANYL CITRATE (PF) 100 MCG/2ML IJ SOLN: 25.0000 ug | INTRAMUSCULAR | Status: DC | PRN

## 2021-10-27 MED ORDER — CEFAZOLIN SODIUM-DEXTROSE 2-4 GM/100ML-% IV SOLN
2.0000 g | INTRAVENOUS | Status: AC
  Administered 2021-10-27: 2 g via INTRAVENOUS

## 2021-10-27 MED ORDER — FAMOTIDINE 20 MG PO TABS: 20.0000 mg | ORAL_TABLET | Freq: Once | ORAL | Status: AC

## 2021-10-27 MED ORDER — ONDANSETRON HCL 4 MG PO TABS: 4.0000 mg | ORAL_TABLET | Freq: Three times a day (TID) | ORAL | 0 refills | Status: AC | PRN

## 2021-10-27 MED ORDER — 0.9 % SODIUM CHLORIDE (POUR BTL) OPTIME
TOPICAL | Status: DC | PRN
  Administered 2021-10-27: 500 mL

## 2021-10-27 MED ORDER — ONDANSETRON HCL 4 MG/2ML IJ SOLN
INTRAMUSCULAR | Status: AC
Start: 2021-10-27 — End: ?
  Filled 2021-10-27: qty 2

## 2021-10-27 MED ORDER — MIDAZOLAM HCL 2 MG/2ML IJ SOLN: 1.0000 mg | Freq: Once | INTRAMUSCULAR | Status: AC

## 2021-10-27 MED ORDER — BUPIVACAINE HCL (PF) 0.5 % IJ SOLN
INTRAMUSCULAR | Status: DC | PRN
  Administered 2021-10-27: 50 mL via PERINEURAL

## 2021-10-27 MED ORDER — SUGAMMADEX SODIUM 200 MG/2ML IV SOLN
INTRAVENOUS | Status: DC | PRN
  Administered 2021-10-27: 200 mg via INTRAVENOUS

## 2021-10-27 MED ORDER — MIDAZOLAM HCL 2 MG/2ML IJ SOLN
INTRAMUSCULAR | Status: AC
  Administered 2021-10-27: 2 mg via INTRAVENOUS
  Filled 2021-10-27: qty 2

## 2021-10-27 MED ORDER — PHENYLEPHRINE HCL-NACL 20-0.9 MG/250ML-% IV SOLN
INTRAVENOUS | Status: DC | PRN
  Administered 2021-10-27: 25 ug/min via INTRAVENOUS

## 2021-10-27 MED ORDER — FAMOTIDINE 20 MG PO TABS
ORAL_TABLET | ORAL | Status: AC
  Administered 2021-10-27: 20 mg via ORAL
  Filled 2021-10-27: qty 1

## 2021-10-27 MED ORDER — OXYCODONE HCL 5 MG PO TABS: 5.0000 mg | ORAL_TABLET | Freq: Once | ORAL | Status: DC | PRN

## 2021-10-27 MED ORDER — FENTANYL CITRATE (PF) 100 MCG/2ML IJ SOLN
INTRAMUSCULAR | Status: DC | PRN
Start: 2021-10-27 — End: 2021-10-27
  Administered 2021-10-27: 50 ug via INTRAVENOUS

## 2021-10-27 MED ORDER — BUPIVACAINE HCL (PF) 0.5 % IJ SOLN
INTRAMUSCULAR | Status: AC
  Filled 2021-10-27: qty 10

## 2021-10-27 MED ORDER — FENTANYL CITRATE (PF) 100 MCG/2ML IJ SOLN
INTRAMUSCULAR | Status: AC
  Filled 2021-10-27: qty 2

## 2021-10-27 MED ORDER — ACETAMINOPHEN 10 MG/ML IV SOLN: 1000.0000 mg | Freq: Once | INTRAVENOUS | Status: DC | PRN

## 2021-10-27 MED ORDER — LIDOCAINE HCL (PF) 1 % IJ SOLN
INTRAMUSCULAR | Status: AC
  Filled 2021-10-27: qty 5

## 2021-10-27 MED ORDER — ACETAMINOPHEN 500 MG PO TABS
ORAL_TABLET | ORAL | Status: AC
  Administered 2021-10-27: 1000 mg via ORAL
  Filled 2021-10-27: qty 2

## 2021-10-27 SURGICAL SUPPLY — 70 items
ADAPTER IRRIG TUBE 2 SPIKE SOL (ADAPTER) ×3 IMPLANT
ANCH SUT 5.5 KNTLS PEEK (Orthopedic Implant) ×2 IMPLANT
ANCHOR ALL-SUT Q-FIX 2.8 (Anchor) ×9 IMPLANT
ANCHOR SUT 5.5 MULTIFIX (Orthopedic Implant) ×2 IMPLANT
ANCHOR SUT BIOC ST 3X145 (Anchor) IMPLANT
CANNULA 5.75X7 CRYSTAL CLEAR (CANNULA) ×3 IMPLANT
CANNULA PARTIAL THREAD 2X7 (CANNULA) ×1 IMPLANT
CANNULA TWIST IN 8.25X9CM (CANNULA) ×3 IMPLANT
CONNECTOR PERFECT PASSER (CONNECTOR) ×3 IMPLANT
COOLER POLAR GLACIER W/PUMP (MISCELLANEOUS) ×2 IMPLANT
DEVICE SUCT BLK HOLE OR FLOOR (MISCELLANEOUS) ×2 IMPLANT
DRAPE 3/4 80X56 (DRAPES) ×2 IMPLANT
DRAPE INCISE IOBAN 66X45 STRL (DRAPES) ×2 IMPLANT
DRAPE U-SHAPE 47X51 STRL (DRAPES) ×2 IMPLANT
DURAPREP 26ML APPLICATOR (WOUND CARE) ×6 IMPLANT
ELECT REM PT RETURN 9FT ADLT (ELECTROSURGICAL) ×2
ELECTRODE REM PT RTRN 9FT ADLT (ELECTROSURGICAL) ×1 IMPLANT
GAUZE SPONGE 4X4 12PLY STRL (GAUZE/BANDAGES/DRESSINGS) ×2 IMPLANT
GAUZE XEROFORM 1X8 LF (GAUZE/BANDAGES/DRESSINGS) ×2 IMPLANT
GLOVE BIOGEL PI IND STRL 9 (GLOVE) ×1 IMPLANT
GLOVE BIOGEL PI INDICATOR 9 (GLOVE) ×1
GLOVE SURG ORTHO 9.0 STRL STRW (GLOVE) ×6 IMPLANT
GOWN STRL REUS TWL 2XL XL LVL4 (GOWN DISPOSABLE) ×2 IMPLANT
GOWN STRL REUS W/ TWL LRG LVL3 (GOWN DISPOSABLE) ×1 IMPLANT
GOWN STRL REUS W/TWL LRG LVL3 (GOWN DISPOSABLE) ×2
IV LACTATED RINGER IRRG 3000ML (IV SOLUTION) ×16
IV LR IRRIG 3000ML ARTHROMATIC (IV SOLUTION) ×8 IMPLANT
KIT STABILIZATION SHOULDER (MISCELLANEOUS) ×2 IMPLANT
KIT SUTURE 2.8 Q-FIX DISP (MISCELLANEOUS) ×2 IMPLANT
KIT SUTURETAK 3.0 INSERT PERC (KITS) IMPLANT
KIT TURNOVER KIT A (KITS) ×2 IMPLANT
MANIFOLD NEPTUNE II (INSTRUMENTS) ×4 IMPLANT
MASK FACE SPIDER DISP (MASK) ×2 IMPLANT
MAT ABSORB  FLUID 56X50 GRAY (MISCELLANEOUS) ×6
MAT ABSORB FLUID 56X50 GRAY (MISCELLANEOUS) ×3 IMPLANT
NDL SAFETY ECLIPSE 18X1.5 (NEEDLE) ×1 IMPLANT
NEEDLE HYPO 18GX1.5 SHARP (NEEDLE) ×2
NEEDLE HYPO 22GX1.5 SAFETY (NEEDLE) ×2 IMPLANT
NS IRRIG 500ML POUR BTL (IV SOLUTION) ×2 IMPLANT
PACK ARTHROSCOPY SHOULDER (MISCELLANEOUS) ×2 IMPLANT
PAD ABD DERMACEA PRESS 5X9 (GAUZE/BANDAGES/DRESSINGS) ×2 IMPLANT
PAD ARMBOARD 7.5X6 YLW CONV (MISCELLANEOUS) ×4 IMPLANT
PAD WRAPON POLAR SHDR XLG (MISCELLANEOUS) ×1 IMPLANT
PASSER SUT FIRSTPASS SELF (INSTRUMENTS) ×2 IMPLANT
SHAVER BLADE BONE CUTTER 4.5 (BLADE) ×2 IMPLANT
SHAVER BLADE TAPERED BLUNT 4 (BLADE) ×2 IMPLANT
SPONGE T-LAP 18X18 ~~LOC~~+RFID (SPONGE) ×2 IMPLANT
STRIP CLOSURE SKIN 1/2X4 (GAUZE/BANDAGES/DRESSINGS) ×2 IMPLANT
SUT ETHILON 4 0 PS 2 18 (SUTURE) ×1 IMPLANT
SUT ETHILON 4-0 (SUTURE) ×2
SUT ETHILON 4-0 FS2 18XMFL BLK (SUTURE) ×1
SUT LASSO 90 DEG SD STR (SUTURE) IMPLANT
SUT MNCRL 4-0 (SUTURE) ×2
SUT MNCRL 4-0 27XMFL (SUTURE) ×1
SUT PDS AB 0 CT1 27 (SUTURE) ×6 IMPLANT
SUT PERFECTPASSER WHITE CART (SUTURE) ×9 IMPLANT
SUT SMART STITCH CARTRIDGE (SUTURE) ×9 IMPLANT
SUT ULTRABRAID 2 COBRAID 38 (SUTURE) IMPLANT
SUT VIC AB 0 CT1 36 (SUTURE) ×6 IMPLANT
SUT VIC AB 2-0 CT2 27 (SUTURE) ×2 IMPLANT
SUTURE ETHLN 4-0 FS2 18XMF BLK (SUTURE) ×1 IMPLANT
SUTURE MNCRL 4-0 27XMF (SUTURE) ×1 IMPLANT
SYR 10ML LL (SYRINGE) ×2 IMPLANT
TAPE MICROFOAM 4IN (TAPE) ×2 IMPLANT
TUBING CONNECTING 10 (TUBING) ×2 IMPLANT
TUBING INFLOW SET DBFLO PUMP (TUBING) ×2 IMPLANT
TUBING OUTFLOW SET DBLFO PUMP (TUBING) ×2 IMPLANT
WAND WEREWOLF FLOW 90D (MISCELLANEOUS) ×2 IMPLANT
WATER STERILE IRR 500ML POUR (IV SOLUTION) ×2 IMPLANT
WRAPON POLAR PAD SHDR XLG (MISCELLANEOUS) ×2

## 2021-10-27 NOTE — Anesthesia Postprocedure Evaluation (Signed)
Anesthesia Post Note  Patient: Desiree Oneal  Procedure(s) Performed: SHOULDER ARTHROSCOPY WITH OPEN ROTATOR CUFF REPAIR AND DISTAL CLAVICLE ACROMINECTOMY with BICEPS TENOTOMY (Right: Shoulder)  Patient location during evaluation: PACU Anesthesia Type: General Level of consciousness: awake and alert Pain management: pain level controlled Vital Signs Assessment: post-procedure vital signs reviewed and stable Respiratory status: spontaneous breathing, nonlabored ventilation and respiratory function stable Cardiovascular status: blood pressure returned to baseline and stable Postop Assessment: no apparent nausea or vomiting Anesthetic complications: no   No notable events documented.   Last Vitals:  Vitals:   10/27/21 1208 10/27/21 1236  BP: 129/73 129/79  Pulse: 62 63  Resp: 14 16  Temp: (!) 36.1 C   SpO2: 100% 100%    Last Pain:  Vitals:   10/27/21 1236  TempSrc:   PainSc: 0-No pain                 [place] Ouch

## 2021-10-27 NOTE — H&P (Signed)
PREOPERATIVE H&P  Chief Complaint: Right Rotator cuff Tear and SLAP Tear  HPI: Desiree Oneal is a 71 y.o. female who presents for preoperative history and physical with a diagnosis of Right Rotator cuff Tear and SLAP Tear. Symptoms of right shoulder pain, limited ROM and weakness are significantly impairing activities of daily living.  Patient has failed nonoperative management wished to proceed with rotator cuff repair surgery.  She has previously undergone a successful left shoulder rotator cuff repair by me in 2018.  Past Medical History:  Diagnosis Date   Abnormal mammogram, unspecified    Allergic genetic state    Arthritis    Benign neoplasm of colon    Glaucoma    HSV-2 infection    Hyperlipidemia    Hypertension    Lump or mass in breast 2013   Obesity, unspecified    Personal history of tobacco use, presenting hazards to health    Special screening for malignant neoplasms, colon    Trigeminal neuralgia of left side of face 2000   Type 2 diabetes mellitus ([place])    Urinary retention 2011   Past Surgical History:  Procedure Laterality Date   ABDOMINAL HYSTERECTOMY     BRAIN SURGERY  2000   Trigeminal neuralgia   BREAST BIOPSY Right 1980   benign (cyst) per pt   BREAST BIOPSY Left 1990   benign   BUNIONECTOMY Right 01/30/2021   Procedure: Jackalyn Lombard;  Surgeon: Samara Deist, DPM;  Location: [facility];  Service: Podiatry;  Laterality: Right;   CARPAL TUNNEL RELEASE Right 01/22/2016   Procedure: RIGHT CARPAL TUNNEL RELEASE;  Surgeon: Leanora Cover, MD;  Location: [place];  Service: Orthopedics;  Laterality: Right;   CHONDROPLASTY  01/25/2017   Procedure: CHONDROPLASTY;  Surgeon: Thornton Park, MD;  Location: [facility];  Service: Orthopedics;;  humeral head   COLONOSCOPY  2008   Dr. Vira Agar   COLONOSCOPY WITH PROPOFOL N/A 01/25/2019   Procedure: COLONOSCOPY WITH PROPOFOL;  Surgeon: Lollie Sails, MD;  Location: [facility]  ENDOSCOPY;  Service: Endoscopy;  Laterality: N/A;   DESTRUCTION TRIGEMINAL NERVE VIA NEUROLYTIC AGENT     ELBOW SURGERY Right    HAND SURGERY Left 2010   HAND SURGERY Right 2011   KNEE SURGERY Right 1998   MASS EXCISION Left 04/20/2016   Procedure: EXCISION OSTEOPHYTE LEFT LONG FINGER DEBRIDEMENT DISTAL INTERPHALANGEAL JOINT;  Surgeon: Leanora Cover, MD;  Location: [place];  Service: Orthopedics;  Laterality: Left;  EXCISION OSTEOPHYTE LEFT LONG FINGER DEBRIDEMENT DISTAL INTERPHALANGEAL JOINT   ROTATOR CUFF REPAIR Right    SHOULDER ARTHROSCOPY WITH OPEN ROTATOR CUFF REPAIR Left 01/25/2017   Procedure: SHOULDER ARTHROSCOPY WITH OPEN ROTATOR CUFF REPAIR;  Surgeon: Thornton Park, MD;  Location: [facility];  Service: Orthopedics;  Laterality: Left;  labral debridement biceps tenotomy   thumb surgery due to fracture     TOE SURGERY     TONSILLECTOMY     TRIGGER FINGER RELEASE Right 06/09/2012   ring finger   WEIL OSTEOTOMY Right 01/30/2021   Procedure: PHALANX OSTEOTOMY-AKIN;  Surgeon: Samara Deist, DPM;  Location: [place];  Service: Podiatry;  Laterality: Right;  Diabetic - oral meds   Social History   Socioeconomic History   Marital status: Married    Spouse name: Desiree Oneal   Number of children: 2   Years of education: Not on file   Highest education level: Not on file  Occupational History   Occupation: Bus/Shuttle Education administrator: [employer]  Tobacco Use   Smoking status: Former    Packs/day: 1.00    Years: 20.00    Total pack years: 20.00    Types: Cigarettes    Quit date: 05/17/2004    Years since quitting: 17.4   Smokeless tobacco: Never  Vaping Use   Vaping Use: Never used  Substance and Sexual Activity   Alcohol use: No    Alcohol/week: 0.0 standard drinks of alcohol   Drug use: No   Sexual activity: Not on file  Other Topics Concern   Not on file  Social History Narrative   Lives with husband, Desiree Oneal.    Social Determinants of  Health   Financial Resource Strain: Not on file  Food Insecurity: Not on file  Transportation Needs: Not on file  Physical Activity: Not on file  Stress: Not on file  Social Connections: Not on file   Family History  Problem Relation Age of Onset   Breast cancer Maternal Grandmother    Blindness Mother    Breast cancer Cousin        maternal   No Known Allergies Prior to Admission medications   Medication Sig Start Date End Date Taking? Authorizing Provider  amLODipine (NORVASC) 5 MG tablet Take 5 mg by mouth daily.   Yes [provider]  ASPIRIN 81 PO Take by mouth daily.   Yes [provider]  carbamazepine (TEGRETOL) 200 MG tablet Take 200 mg by mouth 3 (three) times daily.    Yes [provider]  celecoxib (CELEBREX) 200 MG capsule Take 200 mg by mouth 2 (two) times daily.   Yes [provider]  fexofenadine-pseudoephedrine (ALLEGRA-D) 60-120 MG 12 hr tablet Take 1 tablet by mouth 2 (two) times daily.   Yes [provider]  fluticasone (FLONASE) 50 MCG/ACT nasal spray Place into both nostrils daily as needed for allergies or rhinitis.   Yes [provider]  glipiZIDE (GLUCOTROL XL) 2.5 MG 24 hr tablet Take 2.5 mg by mouth daily with breakfast.   Yes [provider]  latanoprost (XALATAN) 0.005 % ophthalmic solution 1 drop at bedtime.   Yes [provider]  losartan (COZAAR) 50 MG tablet Take 50 mg by mouth daily.   Yes [provider]  oxybutynin (DITROPAN-XL) 10 MG 24 hr tablet Take 10 mg by mouth daily. midday   Yes [provider]  pravastatin (PRAVACHOL) 80 MG tablet Take 80 mg by mouth daily.   Yes [provider]  timolol (BETIMOL) 0.5 % ophthalmic solution Place 1 drop into both eyes daily.   Yes [provider]  traZODone (DESYREL) 100 MG tablet Take 150 mg by mouth at bedtime.   Yes [provider]  triamcinolone cream (KENALOG) 0.1 % Apply 1 application  topically 2 (two) times daily.   Yes [provider]  valACYclovir (VALTREX) 500 MG tablet Take 1,000 mg by mouth daily as needed.   Yes [provider]  zolpidem (AMBIEN) 10 MG tablet Take 10 mg by mouth at bedtime as needed for sleep.   Yes [provider]  diclofenac Sodium (VOLTAREN) 1 % GEL Apply topically.    [provider]  metFORMIN (GLUCOPHAGE) 500 MG tablet Take 1,000 mg by mouth daily with supper.    [provider]  oxyCODONE-acetaminophen (PERCOCET) 5-325 MG tablet Take 1-2 tablets by mouth every 6 (six) hours as needed for severe pain. Max 6 tabs per day Patient not taking: Reported on 10/13/2021 01/30/21  Samara Deist, DPM     Positive ROS: All other systems have been reviewed and were otherwise negative with the exception of those mentioned in the HPI and as above.  Physical Exam: General: Alert, no acute distress Cardiovascular: Regular rate and rhythm, no murmurs rubs or gallops.  No pedal edema Respiratory: Clear to auscultation bilaterally, no wheezes rales or rhonchi. No cyanosis, no use of accessory musculature GI: No organomegaly, abdomen is soft and non-tender nondistended with positive bowel sounds. Skin: Skin intact, no lesions within the operative field. Neurologic: Sensation intact distally Psychiatric: Patient is competent for consent with normal mood and affect Lymphatic: No cervical lymphadenopathy  MUSCULOSKELETAL: Right Shoulder: The patient can forward elevate and abduct to approximately 90 degrees with pain. She has increasing pain with a downward directed force on her abducted shoulder. She demonstrates 4+/5 strength of shoulder abduction, but does not have significant weakness of internal or external rotation with her shoulder by her side. She has positive impingement signs, but no apprehension or instability. She has palpable radial pulse, intact sensation to light touch and intact motor function throughout the  right upper extremity.   Assessment: Right Rotator cuff Tear and SLAP Tear  Plan: Plan for Procedure(s): Admit the patient in the preoperative area this morning.  A preop H&P was performed at the bedside.  The right shoulder was marked according to hospital's correct site of surgery protocol.  Patient received an interscalene block with Exparel by the anesthesia service.  I reviewed the details of the operation as well as the postoperative course with the patient and her daughter who was at the bedside this morning.  I answered all her questions.  Patient is familiar with the operation as I have previously repaired the patient's left shoulder rotator cuff tear.  I discussed the risks and benefits of surgery. The risks include but are not limited to infection, bleeding, nerve or blood vessel injury, joint stiffness or loss of motion, persistent pain, weakness or instability, retear of the rotator cuff, hardware failure and the need for further surgery.  Patient understood these risks and wished to proceed.    Thornton Park, MD   10/27/2021 7:32 AM

## 2021-10-27 NOTE — Op Note (Signed)
10/27/2021  11:42 AM  PATIENT:  Desiree Oneal  71 y.o. female  PRE-OPERATIVE DIAGNOSIS: Recurrent right Rotator cuff Tear and SLAP Tear  POST-OPERATIVE DIAGNOSIS: Recurrent right rotator cuff tear with high-grade partial tear of the long head of the biceps tendon without SLAP tear.  PROCEDURE: Right shoulder arthroscopy with revision subacromial decompression, distal clavicle excision, biceps tenotomy and revision mini open rotator cuff repair  SURGEON:  Surgeon(s) and Role:    Thornton Park, MD - Primary  ANESTHESIA:   general and paracervical block   PREOPERATIVE INDICATIONS:  Desiree Oneal is a  71 y.o. female with a diagnosis of recurrent right Rotator cuff Tear confirmed by MRI who failed conservative measures and elected for surgical management.    The risks benefits and alternatives were discussed with the patient preoperatively including but not limited to the risks of infection, bleeding, nerve injury, persistent pain or weakness, failure of the hardware, re-tear of the rotator cuff and the need for further surgery. Medical risks include DVT and pulmonary embolism, myocardial infarction, stroke, pneumonia, respiratory failure and death. Patient understood these risks and wished to proceed.  OPERATIVE IMPLANTS: Anna MultiFix anchors x 2 & Smith and Nephew Q Fix anchors x 1  OPERATIVE PROCEDURE: The patient was met in the preoperative area with her daughter at the bedside.  Preop history and physical was performed at the bedside this morning.  The right shoulder was signed with the word yes and my initials according the hospital's correct site of surgery protocol.  Patient underwent placement of a right interscalene block with Exparel by the anesthesia service in the preoperative area.  The patient was then brought to the OR and underwent general endotracheal intubation by the anesthesia service.  The patient was placed in a beachchair position.  A spider arm  positioner was used for this case. Examination under anesthesia revealed full passive range of motion of the right shoulder without instability on load-and-shift testing.  The patient was prepped and draped in a sterile fashion. A timeout was performed to verify the patient's name, date of birth, medical record number, correct site of surgery and correct procedure to be performed there was also used to verify the patient received antibiotics that all appropriate instruments, implants and radiographs studies were available in the room. Once all in attendance were in agreement case began.  Bony landmarks were drawn out with a surgical marker along with proposed arthroscopy incisions. These were pre-injected with 1% lidocaine plain. An 11 blade was used to establish a posterior portal through which the arthroscope was placed in the glenohumeral joint. A full diagnostic examination of the shoulder was performed.  The anterior portal was established under direct visualization with an 18-gauge spinal needle.  A 5.75 mm arthroscopic cannula was placed through the anterior portal.  Scar tissue and adhesions were encountered in the rotator interval and debrided with a 90 degree werewolf wand.  The intra-articular portion of the biceps tendon was found to have a partial tear involving greater than 50% of the diameter. Therefore the decision was made to perform a tenotomy. A Smith & Nephew 90 degree werewolf wand was used to release the biceps tendon off the superior labrum. The arthroscopic shaver was then used to debride the frayed edges of the labrum. There were no anterior or superior labral tears seen.  The subscapularis tendon was frayed but demonstrated no full-thickness retracted tear. The patient was found to have a high-grade partial thickness tear involving  the supraspinatus.  A 0 PDS suture was placed through the supraspinatus tear with an 18-gauge spinal needle to help localize the tear from the bursal side.   There were no loose bodies within the inferior recess and no evidence of HAGL lesion.  The arthroscope was then placed in the subacromial space. A lateral portal was then established using an 18-gauge spinal needle for localization.   Subacromial bursitis and adhesions were encountered and debrided using a Dyonics tapered flyer shaver blade and a 90 ArthroCare wand from the lateral portal. A revision subacromial decompression was also performed using a 4.5 mm Dyonics bone cutter shaver blade from the lateral portal.  The 4.5 mm bone cutter shaver blade was then placed through the anterior portal and a revision distal clavicle excision was performed.  Two Smith & Nephew perfect Pass sutures were placed in the lateral border of the rotator cuff tear and final images taken. All arthroscopic instruments were then removed and the mini-open portion of the procedure began.   A saber-type incision was made along the lateral border of the acromion. The deltoid muscle was identified and split in line with its fibers which allowed visualization of the rotator cuff.  The Perfect Pass sutures previously placed in the lateral border of the rotator cuff were also brought out through the deltoid split.   The greater tuberosity was debrided using a 4.5 mm bone cutter shaver blade to remove all remaining torn fibers of the rotator cuff.  Debridement was performed until punctate bleeding was seen at the greater tuberosity footprint, which will allow for rotator cuff healing.    A single Q-Fix anchor was then placed at the articular margin of the humeral head and greater tuberosity. The four suture limbs of the Q Fix anchor were passed medially through the rotator cuff using a first pass suture passer.  Two additional perfect pass sutures were placed in the lateral border of the rotator cuff.  The Perfect Pass sutures from the lateral border of the rotator cuff were then anchored to the greater tuberosity of the humeral head  using two Galt MultiFix anchors. These anchors were tensioned to allow for anatomic reduction of the rotator cuff to the greater tuberosity footprint.  The medial row repair was then completed using an arthroscopic knot tying technique with the Q fix anchor sutures.  Once all sutures were tied down, arthroscopic images of the double row repair were taken with the arthroscope both externally and arthroscopically from the glenohumeral joint  All incisions were copiously irrigated. The deltoid fascia was repaired using a 0 Vicryl suture in interrupted fashion.  The subcutaneous tissue of all incisions were closed with a 2-0 Vicryl. Skin closure for the arthroscopic incisions was performed with 4-0 nylon. The skin edges of the saber incision were approximated with a running 4-0 undyed Monocryl.   A dry sterile dressing including Steri-Strips was applied .  The patient was placed in an abduction sling, with a Polar Care sleeve.  All sharp and instrument counts were correct at the conclusion of the case. I was scrubbed and present for the entire case. I spoke with the patient's daughter in the post-op consultation room and informed her that the case had been performed without complication and the patient was stable in recovery room.     Timoteo Gaul, MD

## 2021-10-27 NOTE — Anesthesia Preprocedure Evaluation (Addendum)
Anesthesia Evaluation  Patient identified by MRN, date of birth, ID band Patient awake    Reviewed: Allergy & Precautions, NPO status , Patient's Chart, lab work & pertinent test results  Airway Mallampati: III  TM Distance: >3 FB Neck ROM: Full    Dental no notable dental hx.    Pulmonary former smoker,    Pulmonary exam normal        Cardiovascular Exercise Tolerance: Good hypertension, Pt. on medications Normal cardiovascular exam     Neuro/Psych  Neuromuscular disease (Trigeminal neuralgia) negative psych ROS   GI/Hepatic negative GI ROS, Neg liver ROS,   Endo/Other  diabetes, Well Controlled, Type 2, Oral Hypoglycemic AgentsBMI 32  Renal/GU      Musculoskeletal  (+) Arthritis , Hallux valgus of right foot   Abdominal (+) + obese,   Peds  Hematology   Anesthesia Other Findings   Reproductive/Obstetrics                            Anesthesia Physical  Anesthesia Plan  ASA: 2  Anesthesia Plan: General   Post-op Pain Management: Regional block* and Tylenol PO (pre-op)*   Induction: Intravenous  PONV Risk Score and Plan: 3 and Ondansetron, Dexamethasone, Treatment may vary due to age or medical condition and Midazolam  Airway Management Planned: Oral ETT  Additional Equipment:   Intra-op Plan:   Post-operative Plan: Extubation in OR  Informed Consent: I have reviewed the patients History and Physical, chart, labs and discussed the procedure including the risks, benefits and alternatives for the proposed anesthesia with the patient or authorized representative who has indicated his/her understanding and acceptance.     Dental advisory given  Plan Discussed with: CRNA and Anesthesiologist  Anesthesia Plan Comments:        Anesthesia Quick Evaluation

## 2021-10-27 NOTE — Discharge Instructions (Addendum)
AMBULATORY SURGERY  DISCHARGE INSTRUCTIONS   The drugs that you were given will stay in your system until tomorrow so for the next 24 hours you should not:  Drive an automobile Make any legal decisions Drink any alcoholic beverage   You may resume regular meals tomorrow.  Today it is better to start with liquids and gradually work up to solid foods.  You may eat anything you prefer, but it is better to start with liquids, then soup and crackers, and gradually work up to solid foods.   Please notify your doctor immediately if you have any unusual bleeding, trouble breathing, redness and pain at the surgery site, drainage, fever, or pain not relieved by medication.    Additional Instructions:     Please contact your physician with any problems or Same Day Surgery at [phone], Monday through Friday 6 am to 4 pm, or  at [facility] Main number at [phone].   POLAR CARE INFORMATION  [email or web address]  How to use [employer] Polar Care [place]ier Cold Therapy System?  YouTube   https://www.youtube.com/watch?v=[email or web address]  OPERATING INSTRUCTIONS  Start the product With dry hands, connect the transformer to the electrical connection located on the top of the cooler. Next, plug the transformer into an appropriate electrical outlet. The unit will automatically start running at this point.  To stop the pump, disconnect electrical power.  Unplug to stop the product when not in use. Unplugging the Polar Care unit turns it off. Always unplug immediately after use. Never leave it plugged in while unattended. Remove pad.    FIRST ADD WATER TO FILL LINE, THEN ICE---Replace ice when existing ice is almost melted  1 Discuss Treatment with your Licensed Health Care Practitioner and Use Only as Prescribed 2 Apply Insulation Barrier & Cold Therapy Pad 3 Check for Moisture 4 Inspect Skin Regularly  Tips and Trouble Shooting Usage Tips 1. Use cubed or chunked ice for optimal  performance. 2. It is recommended to drain the Pad between uses. To drain the pad, hold the Pad upright with the hose pointed toward the ground. Depress the black plunger and allow water to drain out. 3. You may disconnect the Pad from the unit without removing the pad from the affected area by depressing the silver tabs on the hose coupling and gently pulling the hoses apart. The Pad and unit will seal itself and will not leak. Note: Some dripping during release is normal. 4. DO NOT RUN PUMP WITHOUT WATER! The pump in this unit is designed to run with water. Running the unit without water will cause permanent damage to the pump. 5. Unplug unit before removing lid.  TROUBLESHOOTING GUIDE Pump not running, Water not flowing to the pad, Pad is not getting cold 1. Make sure the transformer is plugged into the wall outlet. 2. Confirm that the ice and water are filled to the indicated levels. 3. Make sure there are no kinks in the pad. 4. Gently pull on the blue tube to make sure the tube/pad junction is straight. 5. Remove the pad from the treatment site and ll it while the pad is lying at; then reapply. 6. Confirm that the pad couplings are securely attached to the unit. Listen for the double clicks (Figure 1) to confirm the pad couplings are securely attached.  Leaks    Note: Some condensation on the lines, controller, and pads is unavoidable, especially in warmer climates. 1. If using a [employer] Polar Care Cold Therapy unit with a detachable Cold Therapy Pad,   and a leak exists (other than condensation on the lines) disconnect the pad couplings. Make sure the silver tabs on the couplings are depressed before reconnecting the pad to the pump hose; then confirm both sides of the coupling are properly clicked in. 2. If the coupling continues to leak or a leak is detected in the pad itself, stop using it and call [employer] Customer Care at [phone].  Cleaning After use, empty and dry the unit with a soft  cloth. Warm water and mild detergent may be used occasionally to clean the pump and tubes.  WARNING: The Polar Care Cube can be cold enough to cause serious injury, including full skin necrosis. Follow these Operating Instructions, and carefully read the Product Insert (see pouch on side of unit) and the Cold Therapy Pad Fitting Instructions (provided with each Cold Therapy Pad) prior to use.         

## 2021-10-27 NOTE — Anesthesia Procedure Notes (Signed)
Anesthesia Regional Block: Interscalene brachial plexus block   Pre-Anesthetic Checklist: , timeout performed,  Correct Patient, Correct Site, Correct Laterality,  Correct Procedure, Correct Position, site marked,  Risks and benefits discussed,  Surgical consent,  Pre-op evaluation,  At surgeon's request and post-op pain management  Laterality: Right  Prep: chloraprep       Needles:  Injection technique: Single-shot  Needle Type: Stimiplex     Needle Length: 9cm  Needle Gauge: 22     Additional Needles:   Procedures:,,,, ultrasound used (permanent image in chart),,    Narrative:  Start time: 10/27/2021 7:30 AM End time: 10/27/2021 7:33 AM Injection made incrementally with aspirations every 20 mL.  Performed by: Personally  Anesthesiologist: Iran Ouch, MD  Additional Notes: Patient consented for risk and benefits of nerve block including but not limited to nerve damage, failed block, bleeding and infection.  Patient voiced understanding.  Functioning IV was confirmed and monitors were applied.  Timeout done prior to procedure and prior to any sedation being given to the patient.  Patient confirmed procedure site prior to any sedation given to the patient.  A 19m 22ga Stimuplex needle was used. Sterile prep,hand hygiene and sterile gloves were used.  Minimal sedation used for procedure.  No paresthesia endorsed by patient during the procedure.  Negative aspiration and negative test dose prior to incremental administration of local anesthetic. The patient tolerated the procedure well with no immediate complications.

## 2021-10-27 NOTE — Transfer of Care (Signed)
Immediate Anesthesia Transfer of Care Note  Patient: Desiree Oneal  Procedure(s) Performed: SHOULDER ARTHROSCOPY WITH OPEN ROTATOR CUFF REPAIR AND DISTAL CLAVICLE ACROMINECTOMY with BICEPS TENOTOMY (Right: Shoulder)  Patient Location: PACU  Anesthesia Type:General  Level of Consciousness: drowsy and patient cooperative  Airway & Oxygen Therapy: Patient Spontanous Breathing and Patient connected to face mask oxygen  Post-op Assessment: Report given to RN and Post -op Vital signs reviewed and stable  Post vital signs: Reviewed and stable  Last Vitals:  Vitals Value Taken Time  BP 125/73 10/27/21 1121  Temp    Pulse 86 10/27/21 1121  Resp 16 10/27/21 1121  SpO2 100 % 10/27/21 1121  Vitals shown include unvalidated device data.  Last Pain:  Vitals:   10/27/21 0637  TempSrc: Temporal  PainSc: 6       Patients Stated Pain Goal: 0 (93/81/82 9937)  Complications: No notable events documented.

## 2021-10-27 NOTE — Anesthesia Procedure Notes (Signed)
Procedure Name: Intubation Date/Time: 10/27/2021 7:49 AM  Performed by: Johnna Acosta, CRNAPre-anesthesia Checklist: Patient identified, Emergency Drugs available, Suction available, Patient being monitored and Timeout performed Patient Re-evaluated:Patient Re-evaluated prior to induction Oxygen Delivery Method: Circle system utilized Preoxygenation: Pre-oxygenation with 100% oxygen Induction Type: IV induction Ventilation: Mask ventilation without difficulty and Oral airway inserted - appropriate to patient size Laryngoscope Size: McGraph and 3 Grade View: Grade I Tube type: Oral Tube size: 7.0 mm Number of attempts: 1 Airway Equipment and Method: Stylet and Video-laryngoscopy Placement Confirmation: ETT inserted through vocal cords under direct vision, positive ETCO2 and breath sounds checked- equal and bilateral Secured at: 20 cm Tube secured with: Tape Dental Injury: Teeth and Oropharynx as per pre-operative assessment

## 2021-11-02 ENCOUNTER — Other Ambulatory Visit: Payer: Self-pay

## 2021-11-02 NOTE — Patient Outreach (Signed)
Aging Gracefully Program  RN Visit  11/02/2021  Desiree Oneal 02-May-1951 [id number]  Visit:   Initial Visit  Start Time:  1:00 pm  End Time:  3:20 pm   Total Minutes:   140  Readiness To Change Score:  Readiness to Change Score: 10  Universal RN Interventions: Calendar Distribution: Yes ([employer] Calandar provided. Discussed possible used and encouraged to use calander to help in managment of health) Exercise Review:  (Client informed about exercise booklet. Patient recovering from rotator cuff repair. RNCM will provide at next visit. going to Orthopedic provider on tomorrow and will start outpatient rehab soon.) Medications: Yes Medication Changes: No Mood: No (denies mood issues) Pain: Yes ("7" right shoulde) PCP Advocacy/Support: No Fall Prevention: Yes (discussed the importance of avoiding and preventing falls) Incontinence: No Clinician View Of Client Situation: Client is in recliner chair. Sling to right arm on. She reports had to have rotator cuff repair of the left shoulder, therefore is familiar with recovery process. She has an appointment with her surgeon on tomorrow and will soon begin outpatient therapy. [name]: client with recent right rotator cuff repain done on 10/27/21. She reports she is trying to get past recovery process of rottator cuff repair. Her daughter, Desiree Oneal, family and friends are very supportive  Healthcare Provider Communication: Did Higher education careers adviser With [employer] Provider?: No According to Client, Did PCP Report Communication With An Aging Gracefully RN?: No  Clinician View of Client Situation: Public affairs consultant Of Client Situation: Client is in recliner chair. Sling to right arm on. She reports had to have rotator cuff repair of the left shoulder, therefore is familiar with recovery process. She has an appointment with her surgeon on tomorrow and will soon begin outpatient therapy. Client's View of His/Her  Situation: [name]: client with recent right rotator cuff repain done on 10/27/21. She reports she is trying to get past recovery process of rottator cuff repair. Her daughter, Desiree Oneal, family and friends are very supportive  Medication Assessment: Do You Have Any Problems Paying For Medications?: No Where Does Client Store Medications?: Other: (in container bedroom) Can Client Read Pill Bottles?: Yes Does Client Use A Pillbox?: Yes Does Anyone Assist Client In Filling Pillbox?: No Does Anyone Assist Client In Taking Medications?: No Do You Take Vitamin D?: No Total Number Of Medications That The Client Takes: 15 Does Client Have Any Questions Or Concerns About Medictions?: No Is Client Complaining Of Any Symptoms That Could Be Side Effects To Medications?: No Any Possible Changes In Medication Regimen?: No  OT Update: Client states she is in need of a "reacher"  Session Summary: Initial visit completed. Patient sat in recliner appeared comfortable post right rotator cuff repair. Daughter as side to assist as needed. Client reports she is having some issues with constipation. She reports she has been using some natural strategies and had a small bowel movement on yesterday. Discussed strategies: increase water, increase fiber, oatmeal, apples, flax seed, chia seeds. She reports has an appointment with provider on tomorrow. Patient states she would like to work on fall prevention.  Plan: next visit 11/30/21.  Thea Silversmith, RN, MSN, BSN, [facility] Care Management Coordinator [phone]

## 2021-11-03 NOTE — Patient Instructions (Signed)
Goals Addressed             This Visit's Progress    Patient Stated: Patient will report no falls within the next 120 days       RN Aging Gracefully: Goal: No falls within the next 120 days:  Assessment: Client reports she has not had any falls, but does not want to have any falls in the future. She has recently had right shoulder rotator cuff repair on 10/27/21, which five her limited use of her right arm. She is taking pain medication to help manage the pain.  Goals: Fall prevention strategies: keep walkways clear of clutter, have good lighting in the room, keep animals clear from being under feet that can cause stumbling, remove any throw rugs or tripping hazards.  Attend provider visit as scheduled Attend outpatient rehab as scheduled  Interventions:  Discussed fall prevention strategies  Discussed pain medication and encouraged to be mindful that pain medication can increase fall risk Encouraged to attend provider visit as scheduled Encouraged to attend outpatient rehab as recommended  Plan: next home visit 11/30/21 1:30 pm

## 2021-11-30 ENCOUNTER — Other Ambulatory Visit: Payer: Self-pay

## 2021-11-30 NOTE — Patient Instructions (Signed)
Visit Information  Thank you for taking time to visit with me today. Please don't hesitate to contact me if I can be of assistance to you before our next scheduled telephone appointment.  Following are the goals we discussed today:   Goals Addressed             This Visit's Progress    Patient Stated: Patient will report no falls within the next 120 days       RN Aging Gracefully: Goal: No falls within the next 120 days:  Assessment: Client reports she has not had any falls, but does not want to have any falls in the future. She has recently had right shoulder rotator cuff repair on 10/27/21, which five her limited use of her right arm. She is taking pain medication to help manage the pain.  Goals: Fall prevention strategies: keep walkways clear of clutter, have good lighting in the room, keep animals clear from being under feet that can cause stumbling, remove any throw rugs or tripping hazards.  Attend provider visit as scheduled Attend outpatient rehab as scheduled  Interventions:  Discussed fall prevention strategies  Discussed pain medication and encouraged to be mindful that pain medication can increase fall risk Encouraged to attend provider visit as scheduled Encouraged to attend outpatient rehab as recommended  Plan: next home visit 11/30/21 1:30 pm  11/30/2021 Assessment:  reports she stumbles a lot. No recent falls. Is going to PT twice a week. Throw rugs noted throughout the home. Dogs outside.  Interventions: reviewed pain, reviewed medications for pain. Reviewed home exercise plan and encouraged patient to do her home exercises daily.  Plan: next home visit planned for 01/04/2022  Tomasa Rand RN, BSN, CEN RN Case Manager for [employer] Mobile: [phone]             Our next appointment is  in person  on 01/04/2022 at 11am  Please call the care guide team at [phone] if you need to cancel or reschedule your  appointment.   If you are experiencing a Mental Health or [place] or need someone to talk to, please call the Suicide and Crisis Lifeline: 988 call the [place] National Suicide Prevention Lifeline: 641-287-4849 or TTY: 424-012-8471 TTY 516-434-6661) to talk to a trained counselor call 1-800-273-TALK (toll free, 24 hour hotline) call 911   The patient verbalized understanding of instructions, educational materials, and care plan provided today and agreed to receive a mailed copy of patient instructions, educational materials, and care plan.   Tomasa Rand RN, BSN, Careers information officer for [employer] Mobile: [phone]

## 2021-11-30 NOTE — Patient Outreach (Signed)
Aging Gracefully Program  RN Visit  11/30/2021  Desiree Oneal 02-Nov-1950 [id number]  Visit:   RN home visit #2  Start Time:   1310 End Time:   1410 Total Minutes:   60  Readiness To Change Score:     Universal RN Interventions: Calendar Distribution: Yes Exercise Review: Yes Medications: Yes Medication Changes: No Mood: Yes Pain: Yes PCP Advocacy/Support: No Fall Prevention: Yes Incontinence: No Clinician View Of Client Situation: Client answered the door in a happy fashion.  Sling to the right arm.  Dogs in the back yard. Home neat and clean [name]: Patient reports she is doing well. Reports she goes to PT every Wednesday and Friday. Reports she is sleeping in a reclincer due to pain. Patient reports she has had weatherization completed.  Reports community housing has been out for an assessment.  denies any medication changes. Reports last fall several months ago outside.  Reports she stumbles alot.  Healthcare Provider Communication: none    Clinician View of Client Situation: Clinician View Of Client Situation: Client answered the door in a happy fashion.  Sling to the right arm.  Dogs in the back yard. Home neat and clean Client's View of His/Her Situation: [name]: Patient reports she is doing well. Reports she goes to PT every Wednesday and Friday. Reports she is sleeping in a reclincer due to pain. Patient reports she has had weatherization completed.  Reports community housing has been out for an assessment.  denies any medication changes. Reports last fall several months ago outside.  Reports she stumbles alot.  Medication Assessment:no changes    OT Update: patient states she is interested in a reacher. Email sent to OT.  Session Summary: Patient would benefit from grab bars being installed asap. Will send message to [employer].     Goals Addressed             This Visit's Progress     Patient Stated: Patient will report no falls within the next 120 days       RN Aging Gracefully: Goal: No falls within the next 120 days:  Assessment: Client reports she has not had any falls, but does not want to have any falls in the future. She has recently had right shoulder rotator cuff repair on 10/27/21, which five her limited use of her right arm. She is taking pain medication to help manage the pain.  Goals: Fall prevention strategies: keep walkways clear of clutter, have good lighting in the room, keep animals clear from being under feet that can cause stumbling, remove any throw rugs or tripping hazards.  Attend provider visit as scheduled Attend outpatient rehab as scheduled  Interventions:  Discussed fall prevention strategies  Discussed pain medication and encouraged to be mindful that pain medication can increase fall risk Encouraged to attend provider visit as scheduled Encouraged to attend outpatient rehab as recommended  Plan: next home visit 11/30/21 1:30 pm  11/30/2021 Assessment:  reports she stumbles a lot. No recent falls. Is going to PT twice a week. Throw rugs noted throughout the home. Dogs outside.  Interventions: reviewed pain, reviewed medications for pain. Reviewed home exercise plan and encouraged patient to do her home exercises daily.  Plan: next home visit planned for 01/04/2022  Tomasa Rand RN, BSN, CEN RN Case Manager for [facility] Mobile: [phone]           Tomasa Rand RN, BSN, [employer]  RN Case Freight forwarder for [employer] Mobile: [phone]

## 2021-12-14 ENCOUNTER — Other Ambulatory Visit: Payer: Self-pay | Admitting: Occupational Therapy

## 2021-12-14 NOTE — Patient Instructions (Signed)
She would like to feel safer in her bathrooms.  Guest bath -tub to walk in shower conversion with hand held shower head, and 2 grab bars (one on inside long wall and on outside of tub on the right as you face the tub), Toilet handle frame on comfort height toilet due to no walls to put grab bar(s) on. (MET) Master bath--hand held shower head, grab bars (one on inside long wall and one on outside of tub on left hand side as you face tub. Grab bars on each side of toilet. 1/2 bath--grab bar on right hand side of toilet as you sit on it.  ACTION PLANNING - FUNCTIONAL MOBILITY  Target Problem Area: Safety in the guest bath with toileting   Why Problem May Occur: Difficulty getting up and down from guest bath toilet.   Target Goal: Safety and independence getting on and off toilet   STRATEGIES  Modifying your home environment and making it safe: DO: DON'T:  Use the handles on the toilet to make it easier to get on/off the toilet Try to sit down/stand up without use of handles  Provide adequate lighting Use dim lights or lights that cast a lot of shadows   Practice It is important to practice the strategies so we can determine if they will be effective in helping to reach your goal. Follow these specific recommendations: 1. Keep on using the handles for toilet and the grab bars at other toilets once they are put in.  If a strategy does not work the first time, try it again and again (and maybe again). We may make some changes over the next few sessions, based on how they work.  Golden Circle, OTR/L       12/14/2021

## 2021-12-14 NOTE — Patient Outreach (Signed)
Aging Gracefully Program  OT Follow-Up Visit  12/14/2021  Desiree Oneal 02/13/1951 [id number]  Visit:  2- Second Visit  Start Time:  1600 End Time:  1700 Total Minutes:  60  Readiness to Change Score :  Readiness to Change Score: 10  Durable Medical Equipment: Adaptive Equipment: Reacher, Long Handled [place] Adaptive Equipment Distribution Date: 12/14/21   Goals:   Goals Addressed             This Visit's Progress    Patient Stated       She would like to feel safer in her bathrooms.  Guest bath -tub to walk in shower conversion with hand held shower head, and 2 grab bars (one on inside long wall and on outside of tub on the right as you face the tub), Toilet handle frame on comfort height toilet due to no walls to put grab bar(s) on. (MET) Master bath--hand held shower head, grab bars (one on inside long wall and one on outside of tub on left hand side as you face tub. Grab bars on each side of toilet. 1/2 bath--grab bar on right hand side of toilet as you sit on it.  ACTION PLANNING - FUNCTIONAL MOBILITY  Target Problem Area: Safety in the guest bath with toileting   Why Problem May Occur: Difficulty getting up and down from guest bath toilet.   Target Goal: Safety and independence getting on and off toilet   STRATEGIES  Modifying your home environment and making it safe: DO: DON'T:  Use the handles on the toilet to make it easier to get on/off the toilet Try to sit down/stand up without use of handles  Provide adequate lighting Use dim lights or lights that cast a lot of shadows  Practice It is important to practice the strategies so we can determine if they will be effective in helping to reach your goal. Follow these specific recommendations: 1. Keep on using the handles for toilet and the grab bars at other toilets once they are put in.  If a strategy does not work the first time, try it again and again (and maybe again). We may make some changes over  the next few sessions, based on how they work.  Golden Circle, OTR/L       12/14/2021         Post Clinical Reasoning: Client Action (Goal) Two Interventions: toilet frame on guest bath Did Client Try?: Yes Targeted Problem Area Status: A Lot Better Clinician View Of Client Situation:: [name] is happy with her new toilet frame rails around her comfort height toilet in her guest bath and is looking forward to her tub to shower conversion. She was excited about the ramp, but then when it was going to have to be bigger than she wanted, thus taking up more of her carport/driveway area she decided she did not want it. [name]:: [name] is doing well. She recently had arthroscopic shoulder sx on her right shoulder and is working with therapy 2 times per week. She is managing for herself at home including taking care of her 2 dogs, cat, and chickens. She looks forward to when she can use her right arm more freely without pain Next Visit Plan:: Getting up from a fall, Home safety checklist  Golden Circle, OTR/L [place] [phone] Office [phone]

## 2021-12-19 IMAGING — MG MM DIGITAL SCREENING BILAT W/ TOMO AND CAD
8 series · 8 of 24 positions shown · non-contrast
Comparison: Previous exam(s).

CLINICAL DATA: Screening.

EXAM:
DIGITAL SCREENING BILATERAL MAMMOGRAM WITH TOMOSYNTHESIS AND CAD
TECHNIQUE: Bilateral screening digital craniocaudal and mediolateral oblique
mammograms were obtained. Bilateral screening digital breast
tomosynthesis was performed. The images were evaluated with
computer-aided detection.

[R MLO synth-2D]
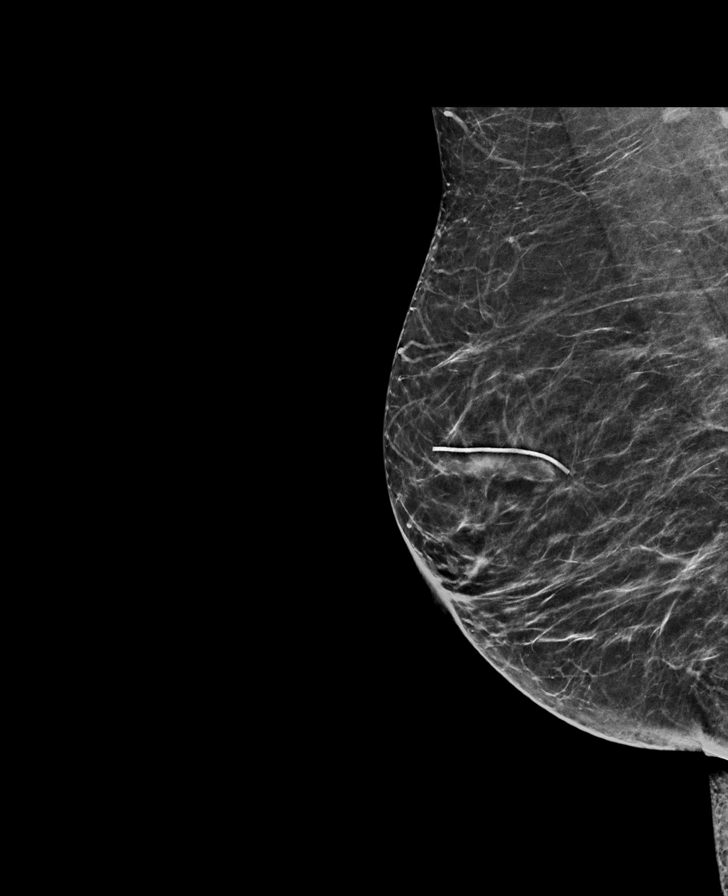

[R CC synth-2D]
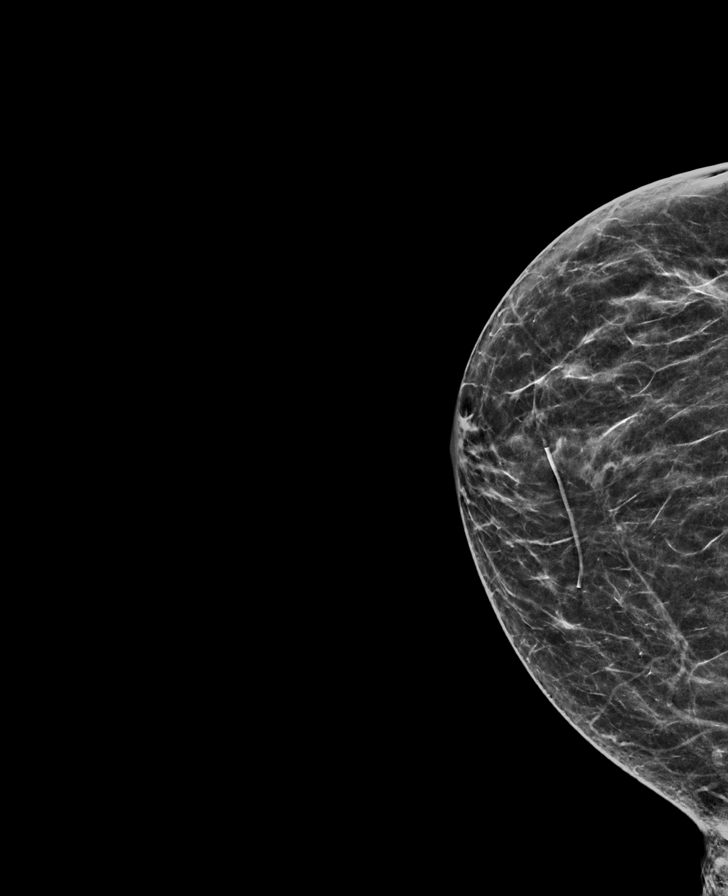

[L CC synth-2D]
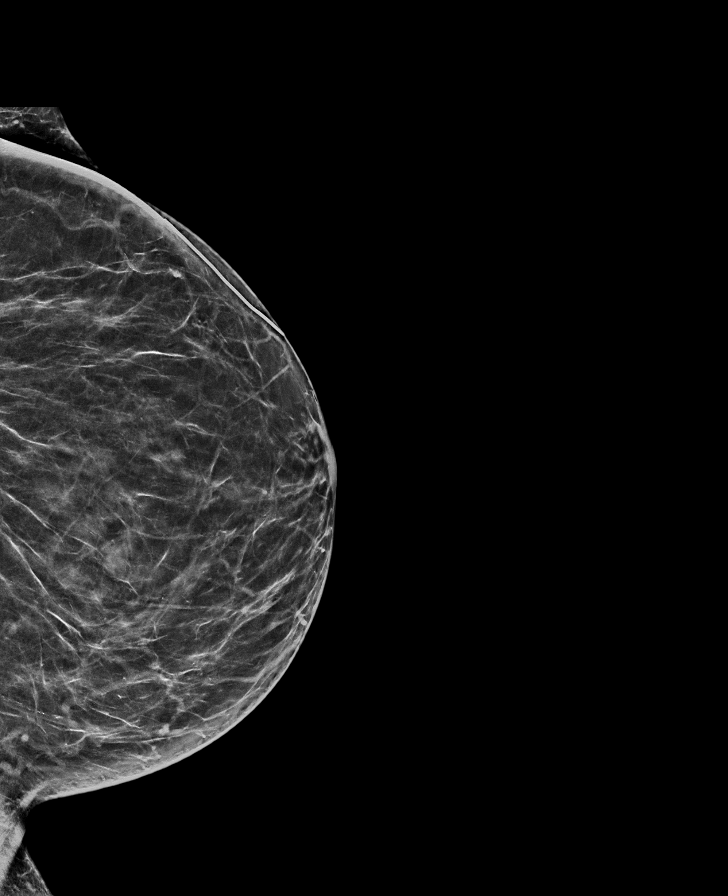

[L MLO synth-2D]
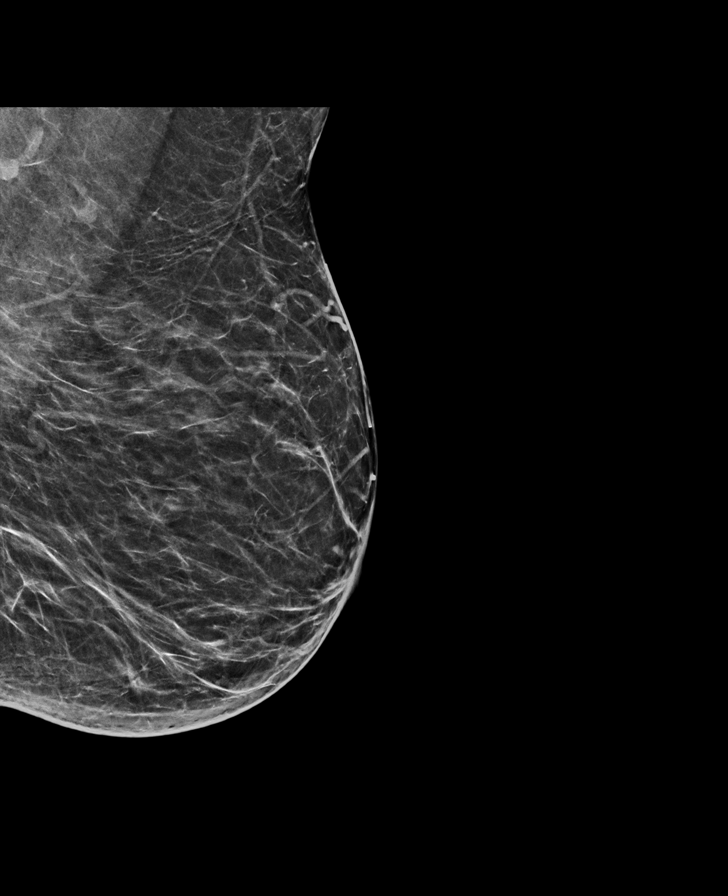

[L MLO tomo · tomo slice 33/66.0]
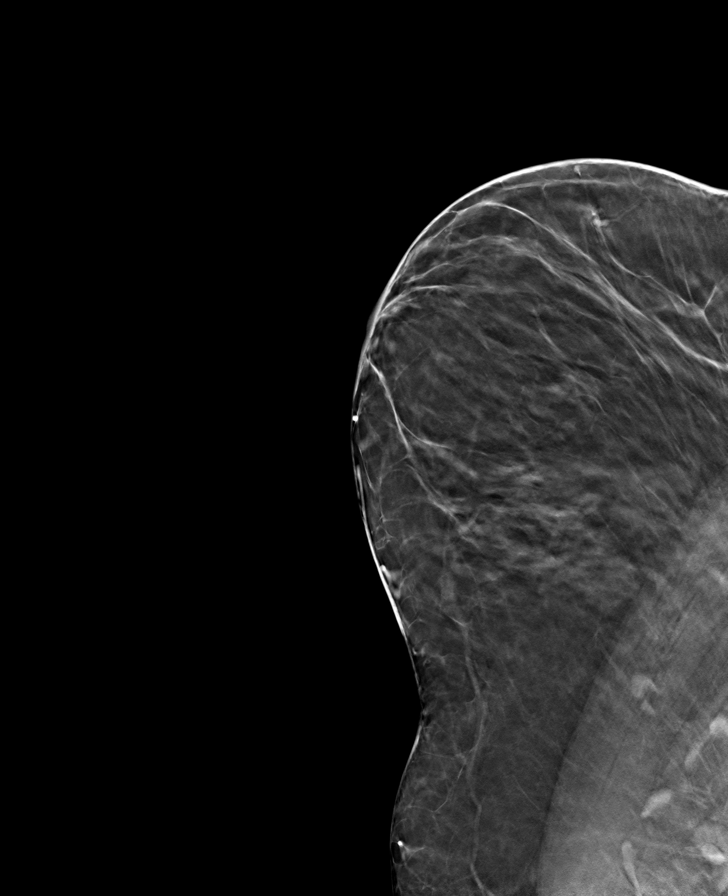

[R MLO tomo · tomo slice 31/61.0]
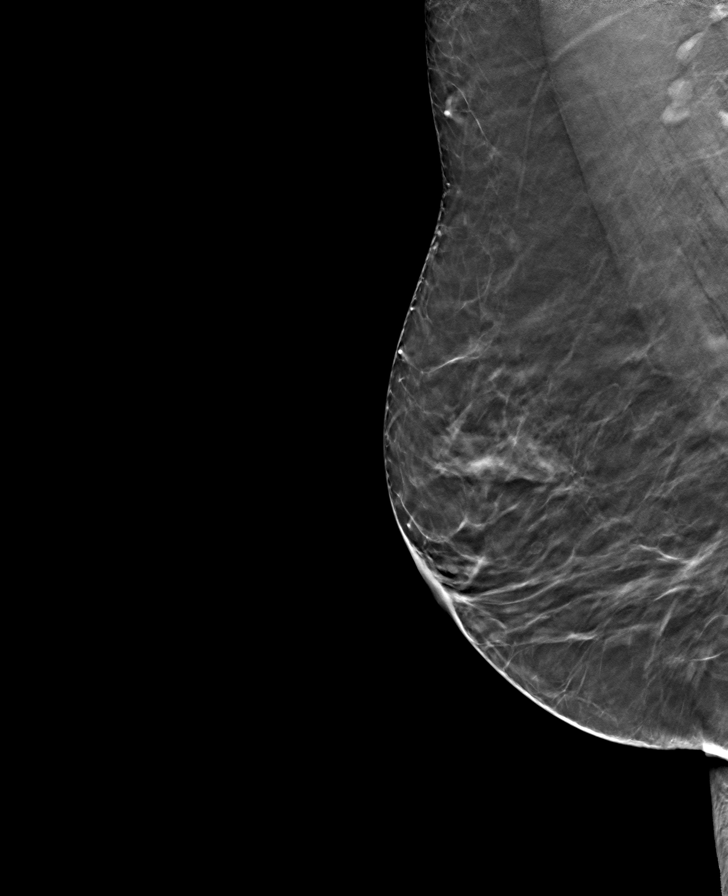

[R CC tomo · tomo slice 30/59.0]
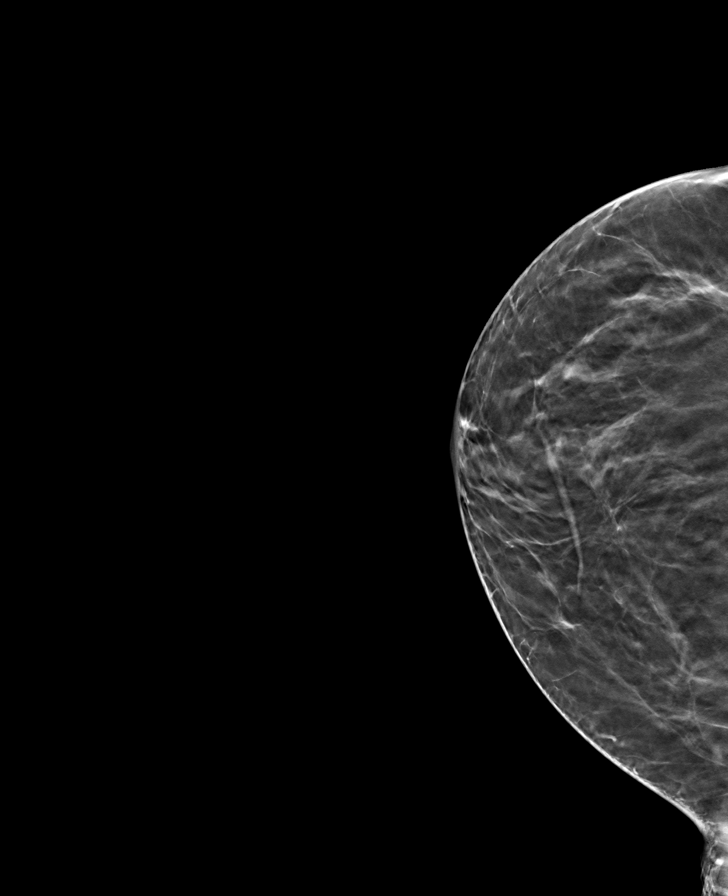

[L CC tomo · tomo slice 33/64.0]
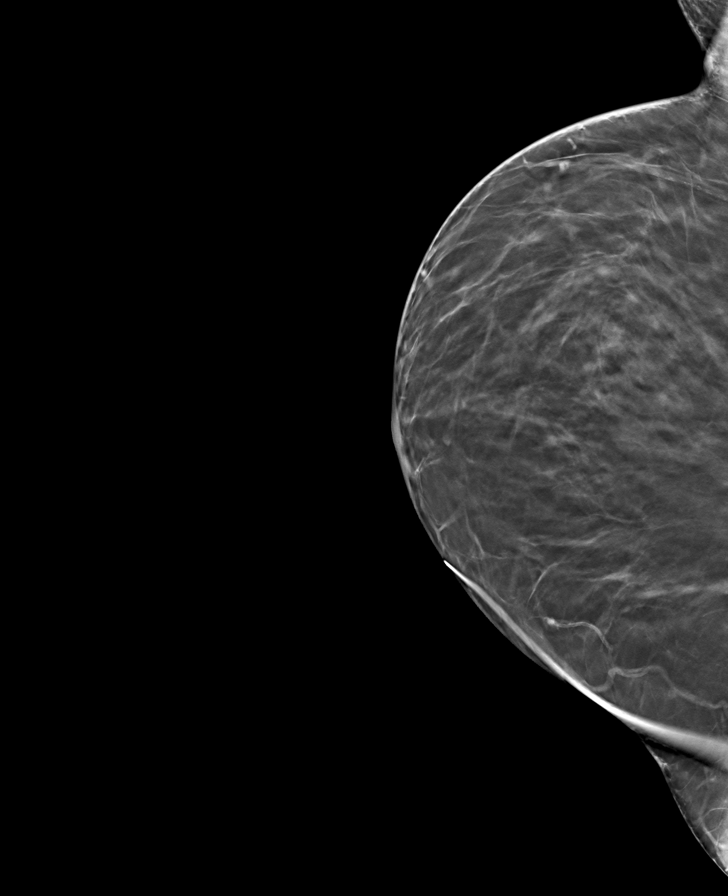

[8 of 24 positions shown; findings below may reference images not displayed]

ACR Breast Density Category b: There are scattered areas of
fibroglandular density.
FINDINGS: There are no findings suspicious for malignancy. The images were
evaluated with computer-aided detection.
IMPRESSION: No mammographic evidence of malignancy. A result letter of this
screening mammogram will be mailed directly to the patient.

RECOMMENDATION:
Screening mammogram in one year. (Code:[id number])

BI-RADS CATEGORY  1: Negative.

## 2021-12-21 ENCOUNTER — Other Ambulatory Visit: Payer: Self-pay

## 2021-12-22 NOTE — Patient Instructions (Signed)
Visit Information  Thank you for taking time to visit with me today. Please don't hesitate to contact me if I can be of assistance to you before our next scheduled telephone appointment.  Following are the goals we discussed today:   Goals Addressed             This Visit's Progress    Patient Stated: Patient will report no falls within the next 120 days       RN Aging Gracefully: Goal: No falls within the next 120 days:  Assessment: Client reports she has not had any falls, but does not want to have any falls in the future. She has recently had right shoulder rotator cuff repair on 10/27/21, which five her limited use of her right arm. She is taking pain medication to help manage the pain.  Goals: Fall prevention strategies: keep walkways clear of clutter, have good lighting in the room, keep animals clear from being under feet that can cause stumbling, remove any throw rugs or tripping hazards.  Attend provider visit as scheduled Attend outpatient rehab as scheduled  Interventions:  Discussed fall prevention strategies  Discussed pain medication and encouraged to be mindful that pain medication can increase fall risk Encouraged to attend provider visit as scheduled Encouraged to attend outpatient rehab as recommended  Plan: next home visit 11/30/21 1:30 pm  11/30/2021 Assessment:  reports she stumbles a lot. No recent falls. Is going to PT twice a week. Throw rugs noted throughout the home. Dogs outside.  Interventions: reviewed pain, reviewed medications for pain. Reviewed home exercise plan and encouraged patient to do her home exercises daily.  Plan: next home visit planned for 01/04/2022  Tomasa Rand RN, BSN, CEN RN Case Manager for [facility] Mobile: [phone]    12/21/2021 Assessment: reports 1 fall last week. No injury. Tripped over dog.  Continues to be active with PT and is currently doing home exercises.  Interventions: assess  for falls and injury. Reviewed fall prevention. Encouraged patient to continue to do her home exercises.  Plan: follow up in 1 month  Tomasa Rand RN, BSN, CEN RN Case Freight forwarder for [facility] Mobile: [phone]           Our next appointment is  in person  on 01/22/2022 at 1130  Please call the care guide team at [phone] if you need to cancel or reschedule your appointment.   If you are experiencing a Mental Health or [place] or need someone to talk to, please call the Suicide and Crisis Lifeline: 988 call the [place] National Suicide Prevention Lifeline: (484)590-0690 or TTY: 364-174-0596 TTY 731-887-8936) to talk to a trained counselor go to [facility] [address] [place] [phone]) call 911   The patient verbalized understanding of instructions, educational materials, and care plan provided today and agreed to receive a mailed copy of patient instructions, educational materials, and care plan.   Tomasa Rand RN, BSN, Careers information officer for [employer] Mobile: [phone]

## 2021-12-22 NOTE — Patient Outreach (Signed)
Aging Gracefully Program  RN Visit  12/22/2021  Desiree Oneal [birth year] [id number]  Visit:   RN home visit #3  Start Time:   2683 End Time:   1500 Total Minutes:   40 Readiness To Change Score:     Universal RN Interventions: Exercise Review: Yes Medications: Yes Medication Changes: Yes Mood: Yes Pain: Yes PCP Advocacy/Support: No Fall Prevention: Yes Incontinence: No Clinician View Of Client Situation: Home neat and clean. No sling today.  Is using her arm more today.  No new concerns. Cooking dinner when I arrived. [name]: Patient reports that she is doing well. Pending home modifications. Continues to do home exercises and is currently still going to PT twice a week. Reports she continues to take pain medications as needed.  denies any medications changes today.  Reports 1 fall when she tripped over her dog. No injury.  Reports she is feeling stronger from exercises.  Healthcare Provider Communication: Did Higher education careers adviser With [employer] Provider?: No According to Client, Did PCP Report Communication With An Aging Gracefully RN?: No  Clinician View of Client Situation: Clinician View Of Client Situation: Home neat and clean. No sling today.  Is using her arm more today.  No new concerns. Cooking dinner when I arrived. Client's View of His/Her Situation: [name]: Patient reports that she is doing well. Pending home modifications. Continues to do home exercises and is currently still going to PT twice a week. Reports she continues to take pain medications as needed.  denies any medications changes today.  Reports 1 fall when she tripped over her dog. No injury.  Reports she is feeling stronger from exercises.  Medication Assessment:denies changes    OT Update: pending home modifications  Session Summary: Doing well continues to go to therapy.1 falls since last home visit.   Goals Addressed             This  Visit's Progress    Patient Stated: Patient will report no falls within the next 120 days       RN Aging Gracefully: Goal: No falls within the next 120 days:  Assessment: Client reports she has not had any falls, but does not want to have any falls in the future. She has recently had right shoulder rotator cuff repair on 10/27/21, which five her limited use of her right arm. She is taking pain medication to help manage the pain.  Goals: Fall prevention strategies: keep walkways clear of clutter, have good lighting in the room, keep animals clear from being under feet that can cause stumbling, remove any throw rugs or tripping hazards.  Attend provider visit as scheduled Attend outpatient rehab as scheduled  Interventions:  Discussed fall prevention strategies  Discussed pain medication and encouraged to be mindful that pain medication can increase fall risk Encouraged to attend provider visit as scheduled Encouraged to attend outpatient rehab as recommended  Plan: next home visit 11/30/21 1:30 pm  11/30/2021 Assessment:  reports she stumbles a lot. No recent falls. Is going to PT twice a week. Throw rugs noted throughout the home. Dogs outside.  Interventions: reviewed pain, reviewed medications for pain. Reviewed home exercise plan and encouraged patient to do her home exercises daily.  Plan: next home visit planned for 01/04/2022  Tomasa Rand RN, BSN, CEN RN Case Manager for [place] [facility]: [phone]    12/21/2021 Assessment: reports 1 fall last week. No injury. Tripped over dog.  Continues to be active with PT and is currently doing home exercises.  Interventions: assess for falls and injury. Reviewed fall prevention. Encouraged patient to continue to do her home exercises.  Plan: follow up in 1 month  Tomasa Rand RN, BSN, Careers information officer for [place] [facility]: [phone]         Tomasa Rand RN,  BSN, Careers information officer for [employer] [facility]: [phone]

## 2022-01-22 ENCOUNTER — Other Ambulatory Visit: Payer: Self-pay

## 2022-01-22 NOTE — Patient Instructions (Signed)
Visit Information  Thank you for taking time to visit with me today. Please don't hesitate to contact me if I can be of assistance to you before our next scheduled telephone appointment.  Following are the goals we discussed today:  Goals Addressed             This Visit's Progress    Patient Stated: Patient will report no falls within the next 120 days       RN Aging Gracefully: Goal: No falls within the next 120 days:  Assessment: Client reports she has not had any falls, but does not want to have any falls in the future. She has recently had right shoulder rotator cuff repair on 10/27/21, which five her limited use of her right arm. She is taking pain medication to help manage the pain.  Goals: Fall prevention strategies: keep walkways clear of clutter, have good lighting in the room, keep animals clear from being under feet that can cause stumbling, remove any throw rugs or tripping hazards.  Attend provider visit as scheduled Attend outpatient rehab as scheduled  Interventions:  Discussed fall prevention strategies  Discussed pain medication and encouraged to be mindful that pain medication can increase fall risk Encouraged to attend provider visit as scheduled Encouraged to attend outpatient rehab as recommended  Plan: next home visit 11/30/21 1:30 pm  11/30/2021 Assessment:  reports she stumbles a lot. No recent falls. Is going to PT twice a week. Throw rugs noted throughout the home. Dogs outside.  Interventions: reviewed pain, reviewed medications for pain. Reviewed home exercise plan and encouraged patient to do her home exercises daily.  Plan: next home visit planned for 01/04/2022  Tomasa Rand RN, BSN, CEN RN Case Manager for [place] [facility]: [phone]    12/21/2021 Assessment: reports 1 fall last week. No injury. Tripped over dog.  Continues to be active with PT and is currently doing home exercises.  Interventions: assess  for falls and injury. Reviewed fall prevention. Encouraged patient to continue to do her home exercises.  Plan: follow up in 1 month  Tomasa Rand RN, BSN, CEN RN Case Freight forwarder for [place] [facility]: [phone]    01/22/2022 Assessment:  Reviewed pain level.  Reviewed falls.  No falls since last home visit.  Interventions: Reviewed importance of continuing to be active and do home exercises.  PLAN: Nursing visits are complete. Patient doing well.  Tomasa Rand, RN, BSN, CEN [place] Coordinator [phone]          If you are experiencing a Mental Health or [place] or need someone to talk to, please call the Suicide and Crisis Lifeline: 988 call the [place] National Suicide Prevention Lifeline: 620 262 4269 or TTY: 9253650539 TTY 780-689-1193) to talk to a trained counselor call 1-800-273-TALK (toll free, 24 hour hotline) go to [facility] [address] [place] [phone]) call 911   The patient verbalized understanding of instructions, educational materials, and care plan provided today and agreed to receive a mailed copy of patient instructions, educational materials, and care plan.   Tomasa Rand RN, BSN, Careers information officer for [employer] [facility]: [phone]

## 2022-01-22 NOTE — Patient Outreach (Signed)
Aging Gracefully Program  RN Visit  01/22/2022  Desiree Oneal 1950/10/19 [id number]  Visit:   RN home visit #4  Start Time:   1130 End Time:   1215 Total Minutes:   47  Readiness To Change Score:     Universal RN Interventions: Calendar Distribution: Yes Exercise Review: Yes Medications: Yes Medication Changes: No Mood: Yes Pain: Yes PCP Advocacy/Support: No Fall Prevention: Yes Incontinence: No Clinician View Of Client Situation: Home neat and clean. Pateint just celebrated her birthday.  In good spirits. [name]: Patient reports no recent falls since last home visit. Reports repairs have not been completed.  Reports that she continues to have pain in the right shoulder/neck. Continues to be active with PT.  reports she is still doing her home exercises. Denies any new problems or concerns.  States that her sister has stage 4 cancer and is on hospice. Reports she is visiting often.  Healthcare Provider Communication: Did Higher education careers adviser With [employer] Provider?: No According to Client, Did PCP Report Communication With An Aging Gracefully RN?: No  Clinician View of Client Situation: Clinician View Of Client Situation: Home neat and clean. Pateint just celebrated her birthday.  In good spirits. Client's View of His/Her Situation: [name]: Patient reports no recent falls since last home visit. Reports repairs have not been completed.  Reports that she continues to have pain in the right shoulder/neck. Continues to be active with PT.  reports she is still doing her home exercises. Denies any new problems or concerns.  States that her sister has stage 4 cancer and is on hospice. Reports she is visiting often.  Medication Assessment: denies any changes to medications    OT Update: pending completion of home modifications  Session Summary: Completed nursing goals. Doing well.   Goals Addressed             This  Visit's Progress    Patient Stated: Patient will report no falls within the next 120 days       RN Aging Gracefully: Goal: No falls within the next 120 days:  Assessment: Client reports she has not had any falls, but does not want to have any falls in the future. She has recently had right shoulder rotator cuff repair on 10/27/21, which five her limited use of her right arm. She is taking pain medication to help manage the pain.  Goals: Fall prevention strategies: keep walkways clear of clutter, have good lighting in the room, keep animals clear from being under feet that can cause stumbling, remove any throw rugs or tripping hazards.  Attend provider visit as scheduled Attend outpatient rehab as scheduled  Interventions:  Discussed fall prevention strategies  Discussed pain medication and encouraged to be mindful that pain medication can increase fall risk Encouraged to attend provider visit as scheduled Encouraged to attend outpatient rehab as recommended  Plan: next home visit 11/30/21 1:30 pm  11/30/2021 Assessment:  reports she stumbles a lot. No recent falls. Is going to PT twice a week. Throw rugs noted throughout the home. Dogs outside.  Interventions: reviewed pain, reviewed medications for pain. Reviewed home exercise plan and encouraged patient to do her home exercises daily.  Plan: next home visit planned for 01/04/2022  Tomasa Rand RN, BSN, CEN RN Case Manager for [place] [facility]: [phone]    12/21/2021 Assessment: reports 1 fall last week. No injury. Tripped over dog.  Continues to be active with  PT and is currently doing home exercises.  Interventions: assess for falls and injury. Reviewed fall prevention. Encouraged patient to continue to do her home exercises.  Plan: follow up in 1 month  Tomasa Rand RN, BSN, CEN RN Case Freight forwarder for [place] [facility]: [phone]    01/22/2022 Assessment:   Reviewed pain level.  Reviewed falls.  No falls since last home visit.  Interventions: Reviewed importance of continuing to be active and do home exercises.  PLAN: Nursing visits are complete. Patient doing well.  Tomasa Rand, RN, BSN, CEN [place] Coordinator [phone]        Tomasa Rand, RN, BSN, CEN [facility] [employer] [phone]

## 2022-02-08 ENCOUNTER — Other Ambulatory Visit: Payer: Self-pay | Admitting: Occupational Therapy

## 2022-02-11 NOTE — Patient Instructions (Addendum)
he would like to feel more safe getting in and out of her house. Considering a ramp at the carport to the far left side as you face the house. Only big enough to fit a wheelchair so she can still get 2 cars under the carport. GOAL DELETED, RAMP WOULD HAVE BEEN TOO LARGE.  She would like for bathing to me easier (long handled sponge will help this). She already has shower seat when needs to use it.MET--client now has a long handled sponge for her legs and one that she can reach her back with given her limited shoulder range of motion.  ACTION PLANNING - DRESSING/BATHING Target Problem Area: Difficulty with bathing   Why Problem May Occur: Difficulty getting upper and lower body bathed due to recent shoulder surgery  Target Goal: Independence with lower body bathing and dressing  STRATEGIES Saving Your Energy: DO: DON'T:  Sit down to do tasks Stand too long while bathing  Use appropriate adaptive equipment:  long handled sponge and curved sponge   Keep all items you'll need within easy reach    Simplifying the way you set up tasks or daily routines: DO: DON'T:  Gather all items before getting started    PRACTICE It is important to practice the strategies so we can determine if they will be effective in helping to reach your goal. Follow these specific recommendations: Use both sponges as needed for upper and lower body bathing to make it easier.  If a strategy does not work the first time, try it again and again (and maybe again). We may make some changes over the next few sessions, based on how they work.  Cathy Leonard, OTR/L        02/08/2022   

## 2022-02-11 NOTE — Patient Outreach (Signed)
Aging Gracefully Program  OT Follow-Up Visit  02/11/2022  Desiree Oneal 03-11-1951 [id number]  Visit:  3- Third Visit  Start Time:  3825 End Time:  1115 Total Minutes:  75  Readiness to Change Score :  Readiness to Change Score: 10  Durable Medical Equipment: Adaptive Equipment: Long Handled Sponge Adaptive Equipment Distribution Date: 02/11/22   Goals:   Goals Addressed             This Visit's Progress    COMPLETED: Patient Stated       She would like for bathing to me easier (long handled sponge will help this). She already has shower seat when needs to use it.MET--client now has a long handled sponge for her legs and one that she can reach her back with given her limited shoulder range of motion.  ACTION PLANNING - DRESSING/BATHING Target Problem Area: Difficulty with bathing   Why Problem May Occur: Difficulty getting upper and lower body bathed due to recent shoulder surgery  Target Goal: Independence with lower body bathing and dressing  STRATEGIES Saving Your Energy: DO: DON'T:  Sit down to do tasks Stand too long while bathing  Use appropriate adaptive equipment:  long handled sponge and curved sponge   Keep all items you'll need within easy reach   Simplifying the way you set up tasks or daily routines: DO: DON'T:  Gather all items before getting started    PRACTICE It is important to practice the strategies so we can determine if they will be effective in helping to reach your goal. Follow these specific recommendations: Use both sponges as needed for upper and lower body bathing to make it easier.  If a strategy does not work the first time, try it again and again (and maybe again). We may make some changes over the next few sessions, based on how they work.  Golden Circle, OTR/L        02/08/2022        Post Clinical Reasoning: Did Client Try?: Yes Targeted Problem Area Status: A Lot Better Client Action (Goal) Three Interventions: can  use both sponges well for different parts of her body Clinician View Of Client Situation:: [name] continues to recover from her shoulder surgery. She recently had a birthday and celebrated with her red hat ladies and family. Her sister passed away the day before our visit. She stays active. [name]:: [name] feels she is continuing to do well post her shoulder surgery. She is determined to keep up her exercises so she gets and maintains good range of motion after her out patient therapy is complete. Next Visit Plan:: See how her bathroom turns out and final education.  Golden Circle, OTR/L Acute Rehab Services Aging Gracefully [phone] Office [phone]

## 2022-03-24 ENCOUNTER — Other Ambulatory Visit: Payer: Self-pay | Admitting: Occupational Therapy

## 2022-03-24 NOTE — Patient Instructions (Addendum)
She would like to know more about sleep strategies. Reports this is not an issue, she uses medication.

## 2022-03-24 NOTE — Patient Outreach (Signed)
Aging Gracefully Program  OT Follow-Up Visit  03/24/2022  Desiree Oneal 1950-07-22 [id number]  Visit:  4- Fourth Visit  Start Time:  1600 End Time:  1700 Total Minutes:  60  Readiness to Change Score :  Readiness to Change Score: 10  Patient Education: Education Provided: Yes Education Details: How to get up from a fall and check for safety handouts. Tips for Aging at Home booklet Person(s) Educated: Patient Comprehension: Verbalized Understanding  Goals:   Goals Addressed             This Visit's Progress    COMPLETED: Patient Stated       She would like to know more about sleep strategies. Reports this is not an issue, she uses medication.        Post Clinical Reasoning: Clinician View Of Client Situation:: Desiree Oneal continues to stay busy. Her right shoulder is doing okay after her surgery but she still lacks AROM in it and is awaiting approval fot more therapy sessions.She appreciates the items [facility] has done at her home but is wondering why she has had to start paying when all the work is not done yet--I reached out to [employer] at [facility] via text and am awaiting his response. SHe also reports the hand held shower they put in her other bathroom is leaking--I will follow up with [facility] on this as well. She is happy with her curved body sponge. [name]:: Desiree Oneal feels she is doing okay post her shoulder surgery but would like for it to be doing better than it is. She would like for her walk in shower to be completed. Next Visit Plan:: Phone call follow up on competed modifications v. in person visit if there are issues.  Golden Circle, OTR/L Acute Rehab Services Aging Gracefully [phone] Office [phone]

## 2022-04-15 ENCOUNTER — Other Ambulatory Visit (INDEPENDENT_AMBULATORY_CARE_PROVIDER_SITE_OTHER): Payer: Self-pay | Admitting: Vascular Surgery

## 2022-04-15 DIAGNOSIS — R9389 Abnormal findings on diagnostic imaging of other specified body structures: Secondary | ICD-10-CM

## 2022-04-16 ENCOUNTER — Encounter (INDEPENDENT_AMBULATORY_CARE_PROVIDER_SITE_OTHER): Payer: BC Managed Care – PPO

## 2022-04-16 ENCOUNTER — Encounter (INDEPENDENT_AMBULATORY_CARE_PROVIDER_SITE_OTHER): Payer: BC Managed Care – PPO | Admitting: Vascular Surgery

## 2022-05-04 ENCOUNTER — Ambulatory Visit (INDEPENDENT_AMBULATORY_CARE_PROVIDER_SITE_OTHER): Payer: Medicare Other | Admitting: Vascular Surgery

## 2022-05-04 ENCOUNTER — Ambulatory Visit (INDEPENDENT_AMBULATORY_CARE_PROVIDER_SITE_OTHER): Payer: Medicare Other

## 2022-05-04 ENCOUNTER — Encounter (INDEPENDENT_AMBULATORY_CARE_PROVIDER_SITE_OTHER): Payer: Self-pay | Admitting: Vascular Surgery

## 2022-05-04 VITALS — BP 116/78 | HR 78 | Resp 16 | Wt 192.4 lb

## 2022-05-04 DIAGNOSIS — R9389 Abnormal findings on diagnostic imaging of other specified body structures: Secondary | ICD-10-CM | POA: Diagnosis not present

## 2022-05-04 DIAGNOSIS — E119 Type 2 diabetes mellitus without complications: Secondary | ICD-10-CM | POA: Diagnosis not present

## 2022-05-04 DIAGNOSIS — I739 Peripheral vascular disease, unspecified: Secondary | ICD-10-CM | POA: Diagnosis not present

## 2022-05-04 DIAGNOSIS — I10 Essential (primary) hypertension: Secondary | ICD-10-CM

## 2022-05-04 NOTE — Progress Notes (Signed)
Patient ID: Desiree Oneal, female   DOB: 1951/05/12, 71 y.o.   MRN: [id number]  Chief Complaint  Patient presents with   New Patient (Initial Visit)    Ref Kary Kos consult abnormal PVD screening    HPI Desiree Oneal is a 71 y.o. female.  I am asked to see the patient by Dr. Kary Kos for evaluation of an abnormal home health screening PAD test.  An abnormal home health screening PAD test.  Her left ABI was found to be 0.8 on this outpatient test with a normal right ABI.  She denies any leg pain or symptoms.  No claudication.  No rest pain.  No ulceration.  We did more detailed studies today demonstrating a right ABI of 1.15 and a left ABI of 1.13 with triphasic waveforms and normal digital pressures and waveforms bilaterally consistent with no arterial insufficiency.     Past Medical History:  Diagnosis Date   Abnormal mammogram, unspecified    Allergic genetic state    Arthritis    Benign neoplasm of colon    Glaucoma    HSV-2 infection    Hyperlipidemia    Hypertension    Lump or mass in breast 2013   Obesity, unspecified    Personal history of tobacco use, presenting hazards to health    Special screening for malignant neoplasms, colon    Trigeminal neuralgia of left side of face 2000   Type 2 diabetes mellitus ([place])    Urinary retention 2011    Past Surgical History:  Procedure Laterality Date   ABDOMINAL HYSTERECTOMY     BRAIN SURGERY  2000   Trigeminal neuralgia   BREAST BIOPSY Right 1980   benign (cyst) per pt   BREAST BIOPSY Left 1990   benign   BUNIONECTOMY Right 01/30/2021   Procedure: Jackalyn Lombard;  Surgeon: Samara Deist, DPM;  Location: [facility];  Service: Podiatry;  Laterality: Right;   CARPAL TUNNEL RELEASE Right 01/22/2016   Procedure: RIGHT CARPAL TUNNEL RELEASE;  Surgeon: Leanora Cover, MD;  Location: [facility];  Service: Orthopedics;  Laterality: Right;   CHONDROPLASTY  01/25/2017   Procedure: CHONDROPLASTY;   Surgeon: Thornton Park, MD;  Location: [facility];  Service: Orthopedics;;  humeral head   COLONOSCOPY  2008   Dr. Vira Agar   COLONOSCOPY WITH PROPOFOL N/A 01/25/2019   Procedure: COLONOSCOPY WITH PROPOFOL;  Surgeon: Lollie Sails, MD;  Location: [facility];  Service: Endoscopy;  Laterality: N/A;   DESTRUCTION TRIGEMINAL NERVE VIA NEUROLYTIC AGENT     ELBOW SURGERY Right    HAND SURGERY Left 2010   HAND SURGERY Right 2011   KNEE SURGERY Right 1998   MASS EXCISION Left 04/20/2016   Procedure: EXCISION OSTEOPHYTE LEFT LONG FINGER DEBRIDEMENT DISTAL INTERPHALANGEAL JOINT;  Surgeon: Leanora Cover, MD;  Location: [place];  Service: Orthopedics;  Laterality: Left;  EXCISION OSTEOPHYTE LEFT LONG FINGER DEBRIDEMENT DISTAL INTERPHALANGEAL JOINT   ROTATOR CUFF REPAIR Right    SHOULDER ARTHROSCOPY WITH OPEN ROTATOR CUFF REPAIR Left 01/25/2017   Procedure: SHOULDER ARTHROSCOPY WITH OPEN ROTATOR CUFF REPAIR;  Surgeon: Thornton Park, MD;  Location: [facility];  Service: Orthopedics;  Laterality: Left;  labral debridement biceps tenotomy   SHOULDER ARTHROSCOPY WITH OPEN ROTATOR CUFF REPAIR AND DISTAL CLAVICLE ACROMINECTOMY Right 10/27/2021   Procedure: SHOULDER ARTHROSCOPY WITH OPEN ROTATOR CUFF REPAIR AND DISTAL CLAVICLE ACROMINECTOMY with BICEPS TENOTOMY;  Surgeon: Thornton Park, MD;  Location: [facility];  Service: Orthopedics;  Laterality: Right;  thumb surgery due to fracture     TOE SURGERY     TONSILLECTOMY     TRIGGER FINGER RELEASE Right 06/09/2012   ring finger   WEIL OSTEOTOMY Right 01/30/2021   Procedure: PHALANX OSTEOTOMY-AKIN;  Surgeon: Samara Deist, DPM;  Location: [place];  Service: Podiatry;  Laterality: Right;  Diabetic - oral meds     Family History  Problem Relation Age of Onset   Breast cancer Maternal Grandmother    Blindness Mother    Breast cancer Cousin        maternal      Social History   Tobacco Use   Smoking status:  Former    Packs/day: 1.00    Years: 20.00    Total pack years: 20.00    Types: Cigarettes    Quit date: 05/17/2004    Years since quitting: 17.9   Smokeless tobacco: Never  Vaping Use   Vaping Use: Never used  Substance Use Topics   Alcohol use: No    Alcohol/week: 0.0 standard drinks of alcohol   Drug use: No     No Known Allergies  Current Outpatient Medications  Medication Sig Dispense Refill   amLODipine (NORVASC) 5 MG tablet Take 5 mg by mouth daily.     ASPIRIN 81 PO Take by mouth daily.     carbamazepine (TEGRETOL) 200 MG tablet Take 200 mg by mouth 3 (three) times daily.      celecoxib (CELEBREX) 200 MG capsule Take 200 mg by mouth 2 (two) times daily.     diclofenac Sodium (VOLTAREN) 1 % GEL Apply topically.     fexofenadine-pseudoephedrine (ALLEGRA-D) 60-120 MG 12 hr tablet Take 1 tablet by mouth 2 (two) times daily.     fluticasone (FLONASE) 50 MCG/ACT nasal spray Place into both nostrils daily as needed for allergies or rhinitis.     glipiZIDE (GLUCOTROL XL) 2.5 MG 24 hr tablet Take 2.5 mg by mouth daily with breakfast.     latanoprost (XALATAN) 0.005 % ophthalmic solution 1 drop at bedtime.     losartan (COZAAR) 50 MG tablet Take 50 mg by mouth daily.     metFORMIN (GLUCOPHAGE) 500 MG tablet Take 1,000 mg by mouth daily with supper.     ondansetron (ZOFRAN) 4 MG tablet Take 1 tablet (4 mg total) by mouth every 8 (eight) hours as needed for nausea or vomiting. 20 tablet 0   oxybutynin (DITROPAN-XL) 10 MG 24 hr tablet Take 10 mg by mouth daily. midday     oxyCODONE (OXY IR/ROXICODONE) 5 MG immediate release tablet Take 1-2 tablets (5-10 mg total) by mouth every 4 (four) hours as needed for moderate pain or severe pain (for POST-OP pain). 30 tablet 0   pravastatin (PRAVACHOL) 80 MG tablet Take 80 mg by mouth daily.     timolol (BETIMOL) 0.5 % ophthalmic solution Place 1 drop into both eyes daily.     traZODone (DESYREL) 100 MG tablet Take 150 mg by mouth at bedtime.      triamcinolone cream (KENALOG) 0.1 % Apply 1 application topically 2 (two) times daily.     valACYclovir (VALTREX) 500 MG tablet Take 1,000 mg by mouth daily as needed.     zolpidem (AMBIEN) 10 MG tablet Take 10 mg by mouth at bedtime as needed for sleep.     No current facility-administered medications for this visit.      REVIEW OF SYSTEMS (Negative unless checked)  Constitutional: '[]'$ Weight loss  '[]'$ Fever  '[]'$ Chills Cardiac: '[]'$ Chest  pain   '[]'$ Chest pressure   '[]'$ Palpitations   '[]'$ Shortness of breath when laying flat   '[]'$ Shortness of breath at rest   '[]'$ Shortness of breath with exertion. Vascular:  '[]'$ Pain in legs with walking   '[]'$ Pain in legs at rest   '[]'$ Pain in legs when laying flat   '[]'$ Claudication   '[]'$ Pain in feet when walking  '[]'$ Pain in feet at rest  '[]'$ Pain in feet when laying flat   '[]'$ History of DVT   '[]'$ Phlebitis   '[]'$ Swelling in legs   '[]'$ Varicose veins   '[]'$ Non-healing ulcers Pulmonary:   '[]'$ Uses home oxygen   '[]'$ Productive cough   '[]'$ Hemoptysis   '[]'$ Wheeze  '[]'$ COPD   '[]'$ Asthma Neurologic:  '[]'$ Dizziness  '[]'$ Blackouts   '[]'$ Seizures   '[]'$ History of stroke   '[]'$ History of TIA  '[]'$ Aphasia   '[]'$ Temporary blindness   '[]'$ Dysphagia   '[]'$ Weakness or numbness in arms   '[]'$ Weakness or numbness in legs Musculoskeletal:  '[x]'$ Arthritis   '[]'$ Joint swelling   '[]'$ Joint pain   '[]'$ Low back pain Hematologic:  '[]'$ Easy bruising  '[]'$ Easy bleeding   '[]'$ Hypercoagulable state   '[]'$ Anemic  '[]'$ Hepatitis Gastrointestinal:  '[]'$ Blood in stool   '[]'$ Vomiting blood  '[]'$ Gastroesophageal reflux/heartburn   '[]'$ Abdominal pain Genitourinary:  '[]'$ Chronic kidney disease   '[]'$ Difficult urination  '[]'$ Frequent urination  '[]'$ Burning with urination   '[]'$ Hematuria Skin:  '[]'$ Rashes   '[]'$ Ulcers   '[]'$ Wounds Psychological:  '[]'$ History of anxiety   '[]'$  History of major depression.    Physical Exam BP 116/78 (BP Location: Left Arm)   Pulse 78   Resp 16   Wt 192 lb 6.4 oz (87.3 kg)   BMI 33.03 kg/m  Gen:  WD/WN, NAD Head: [place]/AT, No temporalis wasting.  Ear/Nose/Throat:  Hearing grossly intact, nares w/o erythema or drainage, oropharynx w/o Erythema/Exudate Eyes: Conjunctiva clear, sclera non-icteric  Neck: trachea midline.  No JVD.  Pulmonary:  Good air movement, respirations not labored, no use of accessory muscles  Cardiac: RRR, no JVD Vascular:  Vessel Right Left  Radial Palpable Palpable                                   Gastrointestinal:. No masses, surgical incisions, or scars. Musculoskeletal: M/S 5/5 throughout.  Extremities without ischemic changes.  No deformity or atrophy. No edema. Neurologic: Sensation grossly intact in extremities.  Symmetrical.  Speech is fluent. Motor exam as listed above. Psychiatric: Judgment intact, Mood & affect appropriate for pt's clinical situation. Dermatologic: No rashes or ulcers noted.  No cellulitis or open wounds.    Radiology No results found.  Labs No results found for this or any previous visit (from the past 2160 hour(s)).  Assessment/Plan:  PAD (peripheral artery disease) ([place]) Patient is referred for home health screening exam suggesting significant peripheral arterial disease. We did more detailed studies today demonstrating a right ABI of 1.15 and a left ABI of 1.13 with triphasic waveforms and normal digital pressures and waveforms bilaterally consistent with no arterial insufficiency.  No further vascular evaluation or follow-up is planned.  Return to clinic as needed.  Type 2 diabetes mellitus without complication, without long-term current use of insulin (HCC) blood glucose control important in reducing the progression of atherosclerotic disease. Also, involved in wound healing. On appropriate medications.   BP (high blood pressure) blood pressure control important in reducing the progression of atherosclerotic disease. On appropriate oral medications.      Leotis Pain 05/04/2022, 11:37 AM   This note  was created with Dragon medical transcription system.  Any errors from  dictation are unintentional.

## 2022-05-04 NOTE — Assessment & Plan Note (Signed)
blood pressure control important in reducing the progression of atherosclerotic disease. On appropriate oral medications.  

## 2022-05-04 NOTE — Assessment & Plan Note (Signed)
Patient is referred for home health screening exam suggesting significant peripheral arterial disease. We did more detailed studies today demonstrating a right ABI of 1.15 and a left ABI of 1.13 with triphasic waveforms and normal digital pressures and waveforms bilaterally consistent with no arterial insufficiency.  No further vascular evaluation or follow-up is planned.  Return to clinic as needed.

## 2022-05-04 NOTE — Assessment & Plan Note (Signed)
blood glucose control important in reducing the progression of atherosclerotic disease. Also, involved in wound healing. On appropriate medications.  

## 2022-06-01 ENCOUNTER — Other Ambulatory Visit: Payer: Self-pay

## 2022-06-01 NOTE — Patient Outreach (Signed)
Aging Gracefully Program  06/01/2022  Desiree Oneal April 27, 1951 [id number]   [facility] Evaluation Interviewer made contact with patient. Aging Gracefully 5 month survey completed.    [place] Management Assistant [phone]

## 2022-06-14 ENCOUNTER — Other Ambulatory Visit: Payer: Self-pay | Admitting: Family Medicine

## 2022-06-14 DIAGNOSIS — Z1231 Encounter for screening mammogram for malignant neoplasm of breast: Secondary | ICD-10-CM

## 2022-07-15 ENCOUNTER — Ambulatory Visit
Admission: RE | Admit: 2022-07-15 | Discharge: 2022-07-15 | Disposition: A | Discharge status: Home / Self Care | Payer: 59 | Source: Ambulatory Visit | Attending: Family Medicine | Admitting: Family Medicine

## 2022-07-15 DIAGNOSIS — Z1231 Encounter for screening mammogram for malignant neoplasm of breast: Secondary | ICD-10-CM | POA: Insufficient documentation

## 2022-10-28 ENCOUNTER — Other Ambulatory Visit: Payer: Self-pay

## 2022-10-28 NOTE — Patient Outreach (Signed)
Aging Gracefully Program  10/28/2022  Desiree Oneal 07/11/1950 [id number]   [facility] Evaluation Interviewer attempted to call patient on today regarding Aging Gracefully referral. Spoke with pt and was informed had a recent death in family. I will call her back at a later date.  [name] Care Management Assistant [phone]

## 2022-12-29 ENCOUNTER — Other Ambulatory Visit: Payer: Self-pay | Admitting: Family Medicine

## 2022-12-29 DIAGNOSIS — M898X9 Other specified disorders of bone, unspecified site: Secondary | ICD-10-CM

## 2022-12-30 ENCOUNTER — Ambulatory Visit
Admission: RE | Admit: 2022-12-30 | Discharge: 2022-12-30 | Disposition: A | Discharge status: Home / Self Care | Payer: 59 | Source: Ambulatory Visit | Attending: Family Medicine | Admitting: Family Medicine

## 2022-12-30 DIAGNOSIS — M898X9 Other specified disorders of bone, unspecified site: Secondary | ICD-10-CM | POA: Diagnosis present

## 2023-01-25 ENCOUNTER — Other Ambulatory Visit: Payer: Self-pay

## 2023-01-25 NOTE — Patient Outreach (Signed)
Aging Gracefully Program  01/25/2023  Desiree Oneal 12/26/1950 [id number]   [facility] Evaluation Interviewer made contact with patient. Aging Gracefully 9 month survey completed.    [name] Care Management Assistant [phone]

## 2023-02-07 ENCOUNTER — Encounter: Payer: Self-pay | Admitting: Urology

## 2023-02-07 ENCOUNTER — Ambulatory Visit (INDEPENDENT_AMBULATORY_CARE_PROVIDER_SITE_OTHER): Payer: 59 | Admitting: Urology

## 2023-02-07 VITALS — BP 143/83 | HR 80 | Ht 64.0 in | Wt 184.0 lb

## 2023-02-07 DIAGNOSIS — N3946 Mixed incontinence: Secondary | ICD-10-CM | POA: Diagnosis not present

## 2023-02-07 DIAGNOSIS — R3129 Other microscopic hematuria: Secondary | ICD-10-CM

## 2023-02-07 LAB — URINALYSIS, COMPLETE
Bilirubin, UA: NEGATIVE
Glucose, UA: NEGATIVE
Ketones, UA: NEGATIVE
Nitrite, UA: NEGATIVE
Protein,UA: NEGATIVE
Specific Gravity, UA: 1.015 (ref 1.005–1.030)
Urobilinogen, Ur: 0.2 mg/dL (ref 0.2–1.0)
pH, UA: 6.5 (ref 5.0–7.5)

## 2023-02-07 LAB — MICROSCOPIC EXAMINATION: RBC, Urine: >30 /hpf — AB (ref 0–2)

## 2023-02-07 LAB — BLADDER SCAN AMB NON-IMAGING

## 2023-02-07 NOTE — Patient Instructions (Signed)

## 2023-02-07 NOTE — Progress Notes (Signed)
Mateo Flow presents for an office visit. BP today is __143/83_________. She is complaintwith BP medication. Greater than 140/90. Provider  notified. Pt advised to__f/u with PCP____________. Pt voiced understanding.

## 2023-02-07 NOTE — Progress Notes (Signed)
02/07/2023 2:10 PM   Desiree Oneal 08/06/1950 [id number]  Referring provider: Jerl Mina, MD [address] [facility] [place],  [place] [address]  Chief Complaint  Patient presents with   New Patient (Initial Visit)   Urinary Incontinence    HPI: I was consulted to assess the patient's urgency and flow symptoms.  She was having urge incontinence but no longer.  No stress in cons or bedwetting.  Not wearing a pad.  She said her flow varies but she is bothered because it takes so long.  She just has to cut it off sometimes and she thinks it is abnormal.  She voids every 2 hours gets up once or twice at night  She smoked many years ago and takes daily aspirin but no blood thinners.  She is on oral hypoglycemics and has had a hysterectomy  No history of kidney stones bladder surgery or bladder infection.  Bowel function normal   PMH: Past Medical History:  Diagnosis Date   Abnormal mammogram, unspecified    Allergic genetic state    Arthritis    Benign neoplasm of colon    Glaucoma    HSV-2 infection    Hyperlipidemia    Hypertension    Lump or mass in breast 2013   Obesity, unspecified    Personal history of tobacco use, presenting hazards to health    Special screening for malignant neoplasms, colon    Trigeminal neuralgia of left side of face 2000   Type 2 diabetes mellitus (HCC)    Urinary retention 2011    Surgical History: Past Surgical History:  Procedure Laterality Date   ABDOMINAL HYSTERECTOMY     BRAIN SURGERY  2000   Trigeminal neuralgia   BREAST BIOPSY Right 1980   benign (cyst) per pt   BREAST BIOPSY Left 1990   benign   BUNIONECTOMY Right 01/30/2021   Procedure: Flavia Shipper;  Surgeon: Gwyneth Revels, DPM;  Location: [facility];  Service: Podiatry;  Laterality: Right;   CARPAL TUNNEL RELEASE Right 01/22/2016   Procedure: RIGHT CARPAL TUNNEL RELEASE;  Surgeon: Betha Loa, MD;  Location: [facility];  Service: Orthopedics;  Laterality: Right;   CHONDROPLASTY  01/25/2017   Procedure: CHONDROPLASTY;  Surgeon: Juanell Fairly, MD;  Location: [facility];  Service: Orthopedics;;  humeral head   COLONOSCOPY  2008   Dr. Mechele Collin   COLONOSCOPY WITH PROPOFOL N/A 01/25/2019   Procedure: COLONOSCOPY WITH PROPOFOL;  Surgeon: Christena Deem, MD;  Location: [facility];  Service: Endoscopy;  Laterality: N/A;   DESTRUCTION TRIGEMINAL NERVE VIA NEUROLYTIC AGENT     ELBOW SURGERY Right    HAND SURGERY Left 2010   HAND SURGERY Right 2011   KNEE SURGERY Right 1998   MASS EXCISION Left 04/20/2016   Procedure: EXCISION OSTEOPHYTE LEFT LONG FINGER DEBRIDEMENT DISTAL INTERPHALANGEAL JOINT;  Surgeon: Betha Loa, MD;  Location: [facility];  Service: Orthopedics;  Laterality: Left;  EXCISION OSTEOPHYTE LEFT LONG FINGER DEBRIDEMENT DISTAL INTERPHALANGEAL JOINT   ROTATOR CUFF REPAIR Right    SHOULDER ARTHROSCOPY WITH OPEN ROTATOR CUFF REPAIR Left 01/25/2017   Procedure: SHOULDER ARTHROSCOPY WITH OPEN ROTATOR CUFF REPAIR;  Surgeon: Juanell Fairly, MD;  Location: [facility];  Service: Orthopedics;  Laterality: Left;  labral debridement biceps tenotomy   SHOULDER ARTHROSCOPY WITH OPEN ROTATOR CUFF REPAIR AND DISTAL CLAVICLE ACROMINECTOMY Right 10/27/2021   Procedure: SHOULDER ARTHROSCOPY WITH OPEN ROTATOR CUFF REPAIR AND DISTAL CLAVICLE ACROMINECTOMY with BICEPS TENOTOMY;  Surgeon:  Juanell Fairly, MD;  Location: [facility];  Service: Orthopedics;  Laterality: Right;   thumb surgery due to fracture     TOE SURGERY     TONSILLECTOMY     TRIGGER FINGER RELEASE Right 06/09/2012   ring finger   WEIL OSTEOTOMY Right 01/30/2021   Procedure: PHALANX OSTEOTOMY-AKIN;  Surgeon: Gwyneth Revels, DPM;  Location: [facility];  Service: Podiatry;  Laterality: Right;  Diabetic - oral meds    Home Medications:  Allergies as of 02/07/2023       Reactions   Amlodipine Swelling, Other (See  Comments)        Medication List        Accurate as of February 07, 2023  2:10 PM. If you have any questions, ask your nurse or doctor.          STOP taking these medications    oxyCODONE 5 MG immediate release tablet Commonly known as: Oxy IR/ROXICODONE Stopped by: Blakely Gluth A Malahki Gasaway       TAKE these medications    amLODipine 5 MG tablet Commonly known as: NORVASC Take 5 mg by mouth daily.   ASPIRIN 81 PO Take by mouth daily.   carbamazepine 200 MG tablet Commonly known as: TEGRETOL Take 200 mg by mouth 3 (three) times daily.   celecoxib 200 MG capsule Commonly known as: CELEBREX Take 200 mg by mouth 2 (two) times daily.   diclofenac Sodium 1 % Gel Commonly known as: VOLTAREN Apply topically.   fexofenadine-pseudoephedrine 60-120 MG 12 hr tablet Commonly known as: ALLEGRA-D Take 1 tablet by mouth 2 (two) times daily.   fluticasone 50 MCG/ACT nasal spray Commonly known as: FLONASE Place into both nostrils daily as needed for allergies or rhinitis.   glipiZIDE 2.5 MG 24 hr tablet Commonly known as: GLUCOTROL XL Take 2.5 mg by mouth daily with breakfast.   latanoprost 0.005 % ophthalmic solution Commonly known as: XALATAN 1 drop at bedtime.   losartan 50 MG tablet Commonly known as: COZAAR Take 50 mg by mouth daily.   metFORMIN 500 MG tablet Commonly known as: GLUCOPHAGE Take 1,000 mg by mouth daily with supper.   ondansetron 4 MG tablet Commonly known as: Zofran Take 1 tablet (4 mg total) by mouth every 8 (eight) hours as needed for nausea or vomiting.   oxybutynin 10 MG 24 hr tablet Commonly known as: DITROPAN-XL Take 10 mg by mouth daily. midday   pravastatin 80 MG tablet Commonly known as: PRAVACHOL Take 80 mg by mouth daily.   timolol 0.5 % ophthalmic solution Commonly known as: BETIMOL Place 1 drop into both eyes daily.   traZODone 100 MG tablet Commonly known as: DESYREL Take 150 mg by mouth at bedtime.   triamcinolone  cream 0.1 % Commonly known as: KENALOG Apply 1 application topically 2 (two) times daily.   valACYclovir 500 MG tablet Commonly known as: VALTREX Take 1,000 mg by mouth daily as needed.   zolpidem 10 MG tablet Commonly known as: AMBIEN Take 10 mg by mouth at bedtime as needed for sleep.        Allergies:  Allergies  Allergen Reactions   Amlodipine Swelling and Other (See Comments)    Family History: Family History  Problem Relation Age of Onset   Breast cancer Maternal Grandmother    Blindness Mother    Breast cancer Cousin        maternal    Social History:  reports that she quit smoking about 18 years ago. Her smoking use  included cigarettes. She started smoking about 38 years ago. She has a 20 pack-year smoking history. She has been exposed to tobacco smoke. She has never used smokeless tobacco. She reports that she does not drink alcohol and does not use drugs.  ROS:                                        Physical Exam: BP (!) 178/112   Pulse 84   Ht 5\' 4"  (1.626 m)   Wt 83.5 kg   BMI 31.58 kg/m    Laboratory Data: Lab Results  Component Value Date   WBC 6.2 01/21/2017   HGB 13.6 01/21/2017   HCT 40.8 01/21/2017   MCV 91.1 01/21/2017   PLT 271 01/21/2017    Lab Results  Component Value Date   CREATININE 0.94 01/21/2017    No results found for: "PSA"  No results found for: "TESTOSTERONE"  Lab Results  Component Value Date   HGBA1C 9.9 (A) 12/12/2019    Urinalysis No results found for: "COLORURINE", "APPEARANCEUR", "LABSPEC", "PHURINE", "GLUCOSEU", "HGBUR", "BILIRUBINUR", "KETONESUR", "PROTEINUR", "UROBILINOGEN", "NITRITE", "LEUKOCYTESUR"  Pertinent Imaging: Urine positive and sent for culture.  Blood in urine.  Chart reviewed Postvoid residual 0 mL  Assessment & Plan: Patient has minimal voiding dysfunction.  She had red blood cells in the urine.  She may or may not have a positive urine culture.  I ordered a CT  scan and will have her come back for cystoscopy for blood in urine.  Flow symptoms are odd and that she has prolonged time for urination but appears to be within normal limits.  I think even if the culture is positive it would be prudent to perform the testing.  1. Mixed incontinence  - Urinalysis, Complete   No follow-ups on file.  Martina Sinner, MD  [facility] [address] [place], [place] [address] [phone]

## 2023-02-10 LAB — CULTURE, URINE COMPREHENSIVE

## 2023-03-21 ENCOUNTER — Telehealth: Payer: Self-pay

## 2023-03-21 ENCOUNTER — Ambulatory Visit: Payer: 59 | Admitting: Urology

## 2023-03-21 ENCOUNTER — Encounter: Payer: Self-pay | Admitting: Urology

## 2023-03-21 VITALS — BP 129/82 | HR 76 | Ht 64.0 in

## 2023-03-21 DIAGNOSIS — R3129 Other microscopic hematuria: Secondary | ICD-10-CM | POA: Diagnosis not present

## 2023-03-21 DIAGNOSIS — N3946 Mixed incontinence: Secondary | ICD-10-CM | POA: Diagnosis not present

## 2023-03-21 LAB — URINALYSIS, COMPLETE
Bilirubin, UA: NEGATIVE
Glucose, UA: NEGATIVE
Ketones, UA: NEGATIVE
Nitrite, UA: NEGATIVE
Protein,UA: NEGATIVE
Specific Gravity, UA: 1.015 (ref 1.005–1.030)
Urobilinogen, Ur: 0.2 mg/dL (ref 0.2–1.0)
pH, UA: 6.5 (ref 5.0–7.5)

## 2023-03-21 LAB — MICROSCOPIC EXAMINATION: Epithelial Cells (non renal): >10 /[HPF] — AB (ref 0–10)

## 2023-03-21 NOTE — Telephone Encounter (Signed)
Pt states she needs a valium to relax her today for her cysto.   [facility].   Pls advise.

## 2023-03-21 NOTE — Patient Instructions (Signed)
CT Hematuria, no solid foods 4 hours prior

## 2023-03-21 NOTE — Progress Notes (Signed)
03/21/2023 2:00 PM   Desiree Oneal [birth year] [id number]  Referring provider: Jerl Mina, MD [address] [facility] [place],  [place] [address]  Chief Complaint  Patient presents with   Cysto    HPI: I was consulted to assess the patient's urgency and flow symptoms.  She was having urge incontinence but no longer.  No stress incontinence or bedwetting.  Not wearing a pad.   She said her flow varies but she is bothered because it takes so long.  She just has to cut it off sometimes and she thinks it is abnormal.  She voids every 2 hours gets up once or twice at night   She smoked many years ago and takes daily aspirin but no blood thinners.  She is on oral hypoglycemics and has had a hysterectomy    Patient has minimal voiding dysfunction. She had red blood cells in the urine. She may or may not have a positive urine culture. I ordered a CT scan and will have her come back for cystoscopy for blood in urine. Flow symptoms are odd and that she has prolonged time for urination but appears to be within normal limits. I think even if the culture is positive it would be prudent to perform the testing.   Today Frequency stable.  CT scan pending.  Last culture negative On pelvic examination no stress incontinence or prolapse. Cystoscopy: Bladder mucosa and trigone were normal.  No cystitis.  No carcinoma.  She was very nervous but was very compliant and did well  PMH: Past Medical History:  Diagnosis Date   Abnormal mammogram, unspecified    Allergic genetic state    Arthritis    Benign neoplasm of colon    Glaucoma    HSV-2 infection    Hyperlipidemia    Hypertension    Lump or mass in breast 2013   Obesity, unspecified    Personal history of tobacco use, presenting hazards to health    Special screening for malignant neoplasms, colon    Trigeminal neuralgia of left side of face 2000   Type 2 diabetes mellitus (HCC)    Urinary retention 2011    Surgical  History: Past Surgical History:  Procedure Laterality Date   ABDOMINAL HYSTERECTOMY     BRAIN SURGERY  2000   Trigeminal neuralgia   BREAST BIOPSY Right 1980   benign (cyst) per pt   BREAST BIOPSY Left 1990   benign   BUNIONECTOMY Right 01/30/2021   Procedure: Flavia Shipper;  Surgeon: Gwyneth Revels, DPM;  Location: [facility];  Service: Podiatry;  Laterality: Right;   CARPAL TUNNEL RELEASE Right 01/22/2016   Procedure: RIGHT CARPAL TUNNEL RELEASE;  Surgeon: Betha Loa, MD;  Location: [facility];  Service: Orthopedics;  Laterality: Right;   CHONDROPLASTY  01/25/2017   Procedure: CHONDROPLASTY;  Surgeon: Juanell Fairly, MD;  Location: [facility];  Service: Orthopedics;;  humeral head   COLONOSCOPY  2008   Dr. Mechele Collin   COLONOSCOPY WITH PROPOFOL N/A 01/25/2019   Procedure: COLONOSCOPY WITH PROPOFOL;  Surgeon: Christena Deem, MD;  Location: [facility];  Service: Endoscopy;  Laterality: N/A;   DESTRUCTION TRIGEMINAL NERVE VIA NEUROLYTIC AGENT     ELBOW SURGERY Right    HAND SURGERY Left 2010   HAND SURGERY Right 2011   KNEE SURGERY Right 1998   MASS EXCISION Left 04/20/2016   Procedure: EXCISION OSTEOPHYTE LEFT LONG FINGER DEBRIDEMENT DISTAL INTERPHALANGEAL JOINT;  Surgeon: Betha Loa, MD;  Location:  [facility];  Service: Orthopedics;  Laterality: Left;  EXCISION OSTEOPHYTE LEFT LONG FINGER DEBRIDEMENT DISTAL INTERPHALANGEAL JOINT   ROTATOR CUFF REPAIR Right    SHOULDER ARTHROSCOPY WITH OPEN ROTATOR CUFF REPAIR Left 01/25/2017   Procedure: SHOULDER ARTHROSCOPY WITH OPEN ROTATOR CUFF REPAIR;  Surgeon: Juanell Fairly, MD;  Location: [facility];  Service: Orthopedics;  Laterality: Left;  labral debridement biceps tenotomy   SHOULDER ARTHROSCOPY WITH OPEN ROTATOR CUFF REPAIR AND DISTAL CLAVICLE ACROMINECTOMY Right 10/27/2021   Procedure: SHOULDER ARTHROSCOPY WITH OPEN ROTATOR CUFF REPAIR AND DISTAL CLAVICLE ACROMINECTOMY with BICEPS  TENOTOMY;  Surgeon: Juanell Fairly, MD;  Location: [facility];  Service: Orthopedics;  Laterality: Right;   thumb surgery due to fracture     TOE SURGERY     TONSILLECTOMY     TRIGGER FINGER RELEASE Right 06/09/2012   ring finger   WEIL OSTEOTOMY Right 01/30/2021   Procedure: PHALANX OSTEOTOMY-AKIN;  Surgeon: Gwyneth Revels, DPM;  Location: [facility];  Service: Podiatry;  Laterality: Right;  Diabetic - oral meds    Home Medications:  Allergies as of 03/21/2023       Reactions   Amlodipine Swelling, Other (See Comments)        Medication List        Accurate as of March 21, 2023  2:00 PM. If you have any questions, ask your nurse or doctor.          amLODipine 5 MG tablet Commonly known as: NORVASC Take 5 mg by mouth daily.   ASPIRIN 81 PO Take by mouth daily.   carbamazepine 200 MG tablet Commonly known as: TEGRETOL Take 200 mg by mouth 3 (three) times daily.   celecoxib 200 MG capsule Commonly known as: CELEBREX Take 200 mg by mouth 2 (two) times daily.   diclofenac Sodium 1 % Gel Commonly known as: VOLTAREN Apply topically.   fexofenadine-pseudoephedrine 60-120 MG 12 hr tablet Commonly known as: ALLEGRA-D Take 1 tablet by mouth 2 (two) times daily.   fluticasone 50 MCG/ACT nasal spray Commonly known as: FLONASE Place into both nostrils daily as needed for allergies or rhinitis.   glipiZIDE 2.5 MG 24 hr tablet Commonly known as: GLUCOTROL XL Take 2.5 mg by mouth daily with breakfast.   latanoprost 0.005 % ophthalmic solution Commonly known as: XALATAN 1 drop at bedtime.   losartan 50 MG tablet Commonly known as: COZAAR Take 50 mg by mouth daily.   metFORMIN 500 MG tablet Commonly known as: GLUCOPHAGE Take 1,000 mg by mouth daily with supper.   ondansetron 4 MG tablet Commonly known as: Zofran Take 1 tablet (4 mg total) by mouth every 8 (eight) hours as needed for nausea or vomiting.   oxybutynin 10 MG 24 hr tablet Commonly known  as: DITROPAN-XL Take 10 mg by mouth daily. midday   Ozempic (0.25 or 0.5 MG/DOSE) 2 MG/3ML Sopn Generic drug: Semaglutide(0.25 or 0.5MG /DOS) Inject into the skin once a week.   pravastatin 80 MG tablet Commonly known as: PRAVACHOL Take 80 mg by mouth daily.   timolol 0.5 % ophthalmic solution Commonly known as: BETIMOL Place 1 drop into both eyes daily.   traZODone 100 MG tablet Commonly known as: DESYREL Take 150 mg by mouth at bedtime.   triamcinolone cream 0.1 % Commonly known as: KENALOG Apply 1 application topically 2 (two) times daily.   valACYclovir 500 MG tablet Commonly known as: VALTREX Take 1,000 mg by mouth daily as needed.   zolpidem 10 MG tablet Commonly known as:  AMBIEN Take 10 mg by mouth at bedtime as needed for sleep.        Allergies:  Allergies  Allergen Reactions   Amlodipine Swelling and Other (See Comments)    Family History: Family History  Problem Relation Age of Onset   Breast cancer Maternal Grandmother    Blindness Mother    Breast cancer Cousin        maternal    Social History:  reports that she quit smoking about 18 years ago. Her smoking use included cigarettes. She started smoking about 38 years ago. She has a 20 pack-year smoking history. She has been exposed to tobacco smoke. She has never used smokeless tobacco. She reports that she does not drink alcohol and does not use drugs.  ROS:                                        Physical Exam: There were no vitals taken for this visit.  Constitutional:  Alert and oriented, No acute distress. HEENT: [place] AT, moist mucus membranes.  Trachea midline, no masses.   Laboratory Data: Lab Results  Component Value Date   WBC 6.2 01/21/2017   HGB 13.6 01/21/2017   HCT 40.8 01/21/2017   MCV 91.1 01/21/2017   PLT 271 01/21/2017    Lab Results  Component Value Date   CREATININE 0.94 01/21/2017    No results found for: "PSA"  No results found for:  "TESTOSTERONE"  Lab Results  Component Value Date   HGBA1C 9.9 (A) 12/12/2019    Urinalysis    Component Value Date/Time   APPEARANCEUR Hazy (A) 02/07/2023 1402   GLUCOSEU Negative 02/07/2023 1402   BILIRUBINUR Negative 02/07/2023 1402   PROTEINUR Negative 02/07/2023 1402   NITRITE Negative 02/07/2023 1402   LEUKOCYTESUR 1+ (A) 02/07/2023 1402    Pertinent Imaging: Urine positive and sent for culture  Assessment & Plan: Patient will have CT scan and I will call with the results.  Difficulty with calling discussed with patient but she preferred this.  Call if culture positive.  She understands that there has been a delay in having the CTs read.  Results of last urinalysis discussed with patient and daughter.  Last culture negative  1. Mixed incontinence  - Urinalysis, Complete   No follow-ups on file.  Martina Sinner, MD  [facility] [address], [address] [place], [place] [address] [phone]

## 2023-03-24 ENCOUNTER — Ambulatory Visit
Admission: RE | Admit: 2023-03-24 | Discharge: 2023-03-24 | Disposition: A | Discharge status: Home / Self Care | Payer: 59 | Source: Ambulatory Visit | Attending: Urology | Admitting: Urology

## 2023-03-24 ENCOUNTER — Other Ambulatory Visit: Payer: Self-pay | Admitting: Family Medicine

## 2023-03-24 DIAGNOSIS — R9341 Abnormal radiologic findings on diagnostic imaging of renal pelvis, ureter, or bladder: Secondary | ICD-10-CM | POA: Insufficient documentation

## 2023-03-24 DIAGNOSIS — N3946 Mixed incontinence: Secondary | ICD-10-CM | POA: Insufficient documentation

## 2023-03-24 DIAGNOSIS — N2889 Other specified disorders of kidney and ureter: Secondary | ICD-10-CM | POA: Diagnosis not present

## 2023-03-24 DIAGNOSIS — R221 Localized swelling, mass and lump, neck: Secondary | ICD-10-CM

## 2023-03-24 LAB — CULTURE, URINE COMPREHENSIVE

## 2023-03-24 LAB — POCT I-STAT CREATININE: Creatinine, Ser: 0.8 mg/dL (ref 0.44–1.00)

## 2023-03-24 MED ORDER — IOHEXOL 300 MG/ML  SOLN
100.0000 mL | Freq: Once | INTRAMUSCULAR | Status: AC | PRN
  Administered 2023-03-24: 100 mL via INTRAVENOUS

## 2023-03-27 ENCOUNTER — Ambulatory Visit
Admission: RE | Admit: 2023-03-27 | Discharge: 2023-03-27 | Disposition: A | Discharge status: Home / Self Care | Payer: 59 | Source: Ambulatory Visit | Attending: Family Medicine | Admitting: Family Medicine

## 2023-03-27 DIAGNOSIS — R221 Localized swelling, mass and lump, neck: Secondary | ICD-10-CM

## 2023-03-27 MED ORDER — GADOBUTROL 1 MMOL/ML IV SOLN
7.5000 mL | Freq: Once | INTRAVENOUS | Status: AC | PRN
  Administered 2023-03-27: 7.5 mL via INTRAVENOUS

## 2023-04-11 ENCOUNTER — Telehealth: Payer: Self-pay | Admitting: *Deleted

## 2023-04-11 ENCOUNTER — Other Ambulatory Visit: Payer: Self-pay

## 2023-04-11 DIAGNOSIS — N2889 Other specified disorders of kidney and ureter: Secondary | ICD-10-CM

## 2023-04-11 NOTE — Telephone Encounter (Signed)
-----   Message from [place] A Macdiarmid sent at 04/11/2023  8:20 AM EST ----- Please let the patient know that the CT scan demonstrated what appears to be a small cyst in the left kidney.  Let her know that cysts are normal but that she should have an MRI of the kidneys with and without gadolinium to make certain it is 100% a cyst.  Please order this for me thank you and we can call her with the results ----- Message ----- From: Interface, Rad Results In Sent: 04/07/2023  12:19 AM EST To: Alfredo Martinez, MD

## 2023-04-11 NOTE — Telephone Encounter (Signed)
.  left message to have patient return my call.  

## 2023-04-11 NOTE — Telephone Encounter (Signed)
Pt informed, voiced understanding

## 2023-09-01 ENCOUNTER — Other Ambulatory Visit: Payer: Self-pay | Admitting: Family Medicine

## 2023-09-01 DIAGNOSIS — Z1231 Encounter for screening mammogram for malignant neoplasm of breast: Secondary | ICD-10-CM

## 2023-09-13 ENCOUNTER — Ambulatory Visit
Admission: RE | Admit: 2023-09-13 | Discharge: 2023-09-13 | Disposition: A | Discharge status: Home / Self Care | Source: Ambulatory Visit | Attending: Family Medicine | Admitting: Family Medicine

## 2023-09-13 DIAGNOSIS — Z1231 Encounter for screening mammogram for malignant neoplasm of breast: Secondary | ICD-10-CM | POA: Diagnosis present

## 2024-02-29 ENCOUNTER — Other Ambulatory Visit: Payer: Self-pay | Admitting: Family Medicine

## 2024-02-29 DIAGNOSIS — M542 Cervicalgia: Secondary | ICD-10-CM

## 2024-02-29 DIAGNOSIS — R937 Abnormal findings on diagnostic imaging of other parts of musculoskeletal system: Secondary | ICD-10-CM

## 2024-03-06 ENCOUNTER — Ambulatory Visit
Admission: RE | Admit: 2024-03-06 | Discharge: 2024-03-06 | Disposition: A | Discharge status: Home / Self Care | Source: Ambulatory Visit | Attending: Family Medicine | Admitting: Family Medicine

## 2024-03-06 DIAGNOSIS — M542 Cervicalgia: Secondary | ICD-10-CM | POA: Insufficient documentation

## 2024-03-06 DIAGNOSIS — R937 Abnormal findings on diagnostic imaging of other parts of musculoskeletal system: Secondary | ICD-10-CM | POA: Insufficient documentation

## 2024-05-22 DIAGNOSIS — R748 Abnormal levels of other serum enzymes: Secondary | ICD-10-CM

## 2024-06-04 ENCOUNTER — Encounter: Payer: Self-pay | Admitting: Gastroenterology

## 2024-06-07 ENCOUNTER — Ambulatory Visit
Admission: RE | Admit: 2024-06-07 | Discharge: 2024-06-07 | Disposition: A | Discharge status: Home / Self Care | Source: Ambulatory Visit

## 2024-06-07 DIAGNOSIS — R748 Abnormal levels of other serum enzymes: Secondary | ICD-10-CM | POA: Insufficient documentation

## 2024-06-13 NOTE — H&P (Signed)
 "  Pre-Procedure H&P   Patient ID: Desiree Oneal is a 74 y.o. female.  Gastroenterology Provider: Elspeth Ozell Jungling, DO  Referring Provider: Lynda Rummer, PA PCP: Valora Lynwood FALCON, MD  Date: 06/14/2024  HPI Desiree Oneal is a 74 y.o. female who presents today for Colonoscopy for Colorectal cancer screening; family history of colon cancer .  Father with history of colon cancer at age 53. Last colonoscopy for patient was in 2020 with diverticulosis and internal hemorrhoids demonstrated  Ozempic held for this procedure (last dose 06/04/24)   Past Medical History:  Diagnosis Date   Abnormal mammogram, unspecified    Allergic genetic state    Arthritis    Benign neoplasm of colon    Glaucoma    HSV-2 infection    Hyperlipidemia    Hypertension    Lump or mass in breast 2013   Obesity, unspecified    Personal history of tobacco use, presenting hazards to health    Special screening for malignant neoplasms, colon    Trigeminal neuralgia of left side of face 2000   Type 2 diabetes mellitus (HCC)    Urinary retention 2011    Past Surgical History:  Procedure Laterality Date   ABDOMINAL HYSTERECTOMY     BRAIN SURGERY  2000   Trigeminal neuralgia   BREAST BIOPSY Right 1980   benign (cyst) per pt   BREAST BIOPSY Left 1990   benign   BUNIONECTOMY Right 01/30/2021   Procedure: ZELL HAI;  Surgeon: Ashley Soulier, DPM;  Location: [facility];  Service: Podiatry;  Laterality: Right;   CARPAL TUNNEL RELEASE Right 01/22/2016   Procedure: RIGHT CARPAL TUNNEL RELEASE;  Surgeon: Franky Curia, MD;  Location: [facility];  Service: Orthopedics;  Laterality: Right;   CHONDROPLASTY  01/25/2017   Procedure: CHONDROPLASTY;  Surgeon: Marchia Franky, MD;  Location: [facility];  Service: Orthopedics;;  humeral head   COLONOSCOPY  2008   Dr. Viktoria   COLONOSCOPY WITH PROPOFOL  N/A 01/25/2019   Procedure: COLONOSCOPY WITH PROPOFOL ;  Surgeon:  Gaylyn Gladis PENNER, MD;  Location: [facility];  Service: Endoscopy;  Laterality: N/A;   DESTRUCTION TRIGEMINAL NERVE VIA NEUROLYTIC AGENT     ELBOW SURGERY Right    HAND SURGERY Left 2010   HAND SURGERY Right 2011   KNEE SURGERY Right 1998   MASS EXCISION Left 04/20/2016   Procedure: EXCISION OSTEOPHYTE LEFT LONG FINGER DEBRIDEMENT DISTAL INTERPHALANGEAL JOINT;  Surgeon: Franky Curia, MD;  Location: [facility];  Service: Orthopedics;  Laterality: Left;  EXCISION OSTEOPHYTE LEFT LONG FINGER DEBRIDEMENT DISTAL INTERPHALANGEAL JOINT   ROTATOR CUFF REPAIR Right    SHOULDER ARTHROSCOPY WITH OPEN ROTATOR CUFF REPAIR Left 01/25/2017   Procedure: SHOULDER ARTHROSCOPY WITH OPEN ROTATOR CUFF REPAIR;  Surgeon: Marchia Franky, MD;  Location: [facility];  Service: Orthopedics;  Laterality: Left;  labral debridement biceps tenotomy   SHOULDER ARTHROSCOPY WITH OPEN ROTATOR CUFF REPAIR AND DISTAL CLAVICLE ACROMINECTOMY Right 10/27/2021   Procedure: SHOULDER ARTHROSCOPY WITH OPEN ROTATOR CUFF REPAIR AND DISTAL CLAVICLE ACROMINECTOMY with BICEPS TENOTOMY;  Surgeon: Marchia Franky, MD;  Location: [facility];  Service: Orthopedics;  Laterality: Right;   thumb surgery due to fracture     TOE SURGERY     TONSILLECTOMY     TRIGGER FINGER RELEASE Right 06/09/2012   ring finger   WEIL OSTEOTOMY Right 01/30/2021   Procedure: PHALANX OSTEOTOMY-AKIN;  Surgeon: Ashley Soulier, DPM;  Location: [facility];  Service: Podiatry;  Laterality: Right;  Diabetic - oral meds    Family History Father- crc- 10 No other h/o GI disease or malignancy  Review of Systems  Constitutional:  Negative for activity change, appetite change, chills, diaphoresis, fatigue, fever and unexpected weight change.  HENT:  Negative for trouble swallowing and voice change.   Respiratory:  Negative for shortness of breath and wheezing.   Cardiovascular:  Negative for chest pain, palpitations and leg swelling.   Gastrointestinal:  Positive for constipation. Negative for abdominal distention, abdominal pain, anal bleeding, blood in stool, diarrhea, nausea, rectal pain and vomiting.  Musculoskeletal:  Negative for arthralgias and myalgias.  Skin:  Negative for color change and pallor.  Neurological:  Negative for dizziness, syncope and weakness.  Psychiatric/Behavioral:  Negative for confusion.   All other systems reviewed and are negative.    Medications Medications Ordered Prior to Encounter[1]  Pertinent medications related to GI and procedure were reviewed by me with the patient prior to the procedure  Current Medications[2]  sodium chloride  20 mL/hr at 06/14/24 0745       Allergies[3] Allergies were reviewed by me prior to the procedure  Objective   Body mass index is 29.41 kg/m. Vitals:   06/14/24 0720  BP: 139/82  Pulse: 81  Resp: 14  Temp: (!) 96.9 F (36.1 C)  TempSrc: Temporal  SpO2: 95%  Weight: 75.3 kg  Height: 5' 3 (1.6 m)     Physical Exam Vitals and nursing note reviewed.  Constitutional:      General: She is not in acute distress.    Appearance: Normal appearance. She is not ill-appearing, toxic-appearing or diaphoretic.  HENT:     Head: Normocephalic and atraumatic.     Nose: Nose normal.     Mouth/Throat:     Mouth: Mucous membranes are moist.     Pharynx: Oropharynx is clear.  Eyes:     General: No scleral icterus.    Extraocular Movements: Extraocular movements intact.  Cardiovascular:     Rate and Rhythm: Normal rate and regular rhythm.     Heart sounds: Normal heart sounds. No murmur heard.    No friction rub. No gallop.  Pulmonary:     Effort: Pulmonary effort is normal. No respiratory distress.     Breath sounds: Normal breath sounds. No wheezing, rhonchi or rales.  Abdominal:     General: Bowel sounds are normal. There is no distension.     Palpations: Abdomen is soft.     Tenderness: There is no abdominal tenderness. There is no  guarding or rebound.  Musculoskeletal:     Cervical back: Neck supple.     Right lower leg: No edema.     Left lower leg: No edema.  Skin:    General: Skin is warm and dry.     Coloration: Skin is not jaundiced or pale.  Neurological:     General: No focal deficit present.     Mental Status: She is alert and oriented to person, place, and time. Mental status is at baseline.  Psychiatric:        Mood and Affect: Mood normal.        Behavior: Behavior normal.        Thought Content: Thought content normal.        Judgment: Judgment normal.      Assessment:  Desiree Oneal is a 74 y.o. female  who presents today for Colonoscopy for Colorectal cancer screening; family history of colon cancer .  Plan:  Colonoscopy with possible intervention today  Colonoscopy with possible biopsy, control of bleeding, polypectomy, and interventions as necessary has been discussed with the patient/patient representative. Informed consent was obtained from the patient/patient representative after explaining the indication, nature, and risks of the procedure including but not limited to death, bleeding, perforation, missed neoplasm/lesions, cardiorespiratory compromise, and reaction to medications. Opportunity for questions was given and appropriate answers were provided. Patient/patient representative has verbalized understanding is amenable to undergoing the procedure.   Elspeth Ozell Jungling, DO  [facility]  Portions of the record may have been created with voice recognition software. Occasional wrong-word or 'sound-a-like' substitutions may have occurred due to the inherent limitations of voice recognition software.  Read the chart carefully and recognize, using context, where substitutions may have occurred.     [1]  No current facility-administered medications on file prior to encounter.   Current Outpatient Medications on File Prior to Encounter  Medication Sig  Dispense Refill   amLODipine (NORVASC) 5 MG tablet Take 5 mg by mouth daily.     celecoxib (CELEBREX) 200 MG capsule Take 200 mg by mouth 2 (two) times daily.     glipiZIDE (GLUCOTROL XL) 2.5 MG 24 hr tablet Take 2.5 mg by mouth daily with breakfast.     losartan (COZAAR) 50 MG tablet Take 50 mg by mouth daily.     metFORMIN (GLUCOPHAGE) 500 MG tablet Take 1,000 mg by mouth daily with supper.     oxybutynin (DITROPAN-XL) 10 MG 24 hr tablet Take 10 mg by mouth daily. midday     pravastatin (PRAVACHOL) 80 MG tablet Take 80 mg by mouth daily.     valACYclovir (VALTREX) 500 MG tablet Take 1,000 mg by mouth daily as needed.     ASPIRIN 81 PO Take by mouth daily. (Patient not taking: Reported on 06/14/2024)     carbamazepine  (TEGRETOL ) 200 MG tablet Take 200 mg by mouth 3 (three) times daily.  (Patient not taking: Reported on 06/14/2024)     diclofenac Sodium (VOLTAREN) 1 % GEL Apply topically.     fexofenadine-pseudoephedrine (ALLEGRA-D) 60-120 MG 12 hr tablet Take 1 tablet by mouth 2 (two) times daily.     fluticasone (FLONASE) 50 MCG/ACT nasal spray Place into both nostrils daily as needed for allergies or rhinitis.     latanoprost (XALATAN) 0.005 % ophthalmic solution 1 drop at bedtime.     ondansetron  (ZOFRAN ) 4 MG tablet Take 1 tablet (4 mg total) by mouth every 8 (eight) hours as needed for nausea or vomiting. 20 tablet 0   OZEMPIC, 0.25 OR 0.5 MG/DOSE, 2 MG/3ML SOPN Inject into the skin once a week.     timolol (BETIMOL) 0.5 % ophthalmic solution Place 1 drop into both eyes daily.     traZODone (DESYREL) 100 MG tablet Take 150 mg by mouth at bedtime.     triamcinolone cream (KENALOG) 0.1 % Apply 1 application topically 2 (two) times daily.     zolpidem (AMBIEN) 10 MG tablet Take 10 mg by mouth at bedtime as needed for sleep.    [2]  Current Facility-Administered Medications:    0.9 %  sodium chloride  infusion, , Intravenous, Continuous, Jungling Elspeth Ozell, DO, Last Rate: 20 mL/hr at  06/14/24 0745, New Bag at 06/14/24 0745 [3]  Allergies Allergen Reactions   Amlodipine Swelling and Other (See Comments)   "

## 2024-06-14 ENCOUNTER — Encounter: Payer: Self-pay | Admitting: Gastroenterology

## 2024-06-14 ENCOUNTER — Ambulatory Visit
Admission: RE | Admit: 2024-06-14 | Discharge: 2024-06-14 | Disposition: A | Discharge status: Home / Self Care | Attending: Gastroenterology | Admitting: Gastroenterology

## 2024-06-14 ENCOUNTER — Encounter: Admission: RE | Disposition: A | Payer: Self-pay | Source: Home / Self Care | Attending: Gastroenterology

## 2024-06-14 ENCOUNTER — Encounter

## 2024-06-14 ENCOUNTER — Other Ambulatory Visit: Payer: Self-pay

## 2024-06-14 DIAGNOSIS — Z800 Family history of malignant neoplasm of digestive organs: Secondary | ICD-10-CM | POA: Insufficient documentation

## 2024-06-14 DIAGNOSIS — I10 Essential (primary) hypertension: Secondary | ICD-10-CM | POA: Insufficient documentation

## 2024-06-14 DIAGNOSIS — K5730 Diverticulosis of large intestine without perforation or abscess without bleeding: Secondary | ICD-10-CM | POA: Insufficient documentation

## 2024-06-14 DIAGNOSIS — Z87891 Personal history of nicotine dependence: Secondary | ICD-10-CM | POA: Insufficient documentation

## 2024-06-14 DIAGNOSIS — Z7984 Long term (current) use of oral hypoglycemic drugs: Secondary | ICD-10-CM | POA: Diagnosis not present

## 2024-06-14 DIAGNOSIS — K621 Rectal polyp: Secondary | ICD-10-CM | POA: Diagnosis not present

## 2024-06-14 DIAGNOSIS — Z7985 Long-term (current) use of injectable non-insulin antidiabetic drugs: Secondary | ICD-10-CM | POA: Insufficient documentation

## 2024-06-14 DIAGNOSIS — E1151 Type 2 diabetes mellitus with diabetic peripheral angiopathy without gangrene: Secondary | ICD-10-CM | POA: Insufficient documentation

## 2024-06-14 DIAGNOSIS — Z1211 Encounter for screening for malignant neoplasm of colon: Secondary | ICD-10-CM | POA: Diagnosis present

## 2024-06-14 DIAGNOSIS — D122 Benign neoplasm of ascending colon: Secondary | ICD-10-CM | POA: Insufficient documentation

## 2024-06-14 LAB — GLUCOSE, CAPILLARY: Glucose-Capillary: 89 mg/dL (ref 70–99)

## 2024-06-14 MED ORDER — SODIUM CHLORIDE 0.9 % IV SOLN: INTRAVENOUS | Status: DC

## 2024-06-14 MED ORDER — LIDOCAINE HCL (CARDIAC) PF 100 MG/5ML IV SOSY
PREFILLED_SYRINGE | INTRAVENOUS | Status: DC | PRN
  Administered 2024-06-14: 70 mg via INTRAVENOUS

## 2024-06-14 MED ORDER — PHENYLEPHRINE 80 MCG/ML (10ML) SYRINGE FOR IV PUSH (FOR BLOOD PRESSURE SUPPORT)
PREFILLED_SYRINGE | INTRAVENOUS | Status: DC | PRN
  Administered 2024-06-14: 120 ug via INTRAVENOUS
  Administered 2024-06-14: 80 ug via INTRAVENOUS

## 2024-06-14 MED ORDER — PROPOFOL 10 MG/ML IV BOLUS
INTRAVENOUS | Status: DC | PRN
  Administered 2024-06-14: 50 mg via INTRAVENOUS
  Administered 2024-06-14: 75 ug/kg/min via INTRAVENOUS
  Administered 2024-06-14: 50 mg via INTRAVENOUS
  Administered 2024-06-14: 30 mg via INTRAVENOUS

## 2024-06-14 NOTE — Op Note (Signed)
 [facility] Patient Name: Desiree Oneal Procedure Date: 06/14/2024 6:48 AM MRN: [id number] Account #: [id number] Date of Birth: 1950/07/25 Admit Type: Outpatient Age: 74 Room: [facility] ENDO ROOM 1 Gender: Female Note Status: Finalized Instrument Name: Colon Scope [id number] Procedure:             Colonoscopy Indications:           Screening patient at increased risk: Family history of                         1st-degree relative with colorectal cancer at age 38                         years (or older) Providers:             Elspeth Ozell Onita ROSALEA, DO Referring MD:          Lynwood FALCON. Valora, MD (Referring MD) Medicines:             Monitored Anesthesia Care Complications:         No immediate complications. Estimated blood loss:                         Minimal. Procedure:             Pre-Anesthesia Assessment:                        - Prior to the procedure, a History and Physical was                         performed, and patient medications and allergies were                         reviewed. The patient is competent. The risks and                         benefits of the procedure and the sedation options and                         risks were discussed with the patient. All questions                         were answered and informed consent was obtained.                         Patient identification and proposed procedure were                         verified by the physician, the nurse, the anesthetist                         and the technician in the endoscopy suite. Mental                         Status Examination: alert and oriented. Airway                         Examination: normal oropharyngeal airway and neck  mobility. Respiratory Examination: clear to                         auscultation. CV Examination: RRR, no murmurs, no S3                         or S4. Prophylactic Antibiotics: The patient does not                          require prophylactic antibiotics. Prior                         Anticoagulants: The patient has taken no anticoagulant                         or antiplatelet agents. ASA Grade Assessment: II - A                         patient with mild systemic disease. After reviewing                         the risks and benefits, the patient was deemed in                         satisfactory condition to undergo the procedure. The                         anesthesia plan was to use monitored anesthesia care                         (MAC). Immediately prior to administration of                         medications, the patient was re-assessed for adequacy                         to receive sedatives. The heart rate, respiratory                         rate, oxygen saturations, blood pressure, adequacy of                         pulmonary ventilation, and response to care were                         monitored throughout the procedure. The physical                         status of the patient was re-assessed after the                         procedure.                        After obtaining informed consent, the colonoscope was                         passed under direct vision. Throughout the procedure,  the patient's blood pressure, pulse, and oxygen                         saturations were monitored continuously. The                         Colonoscope was introduced through the anus and                         advanced to the the cecum, identified by appendiceal                         orifice and ileocecal valve. The colonoscopy was                         performed without difficulty. The patient tolerated                         the procedure well. The quality of the bowel                         preparation was evaluated using the BBPS [facility] Bowel                         Preparation Scale) with scores of: Right Colon = 2                         (minor amount of  residual staining, small fragments of                         stool and/or opaque liquid, but mucosa seen well),                         Transverse Colon = 3 (entire mucosa seen well with no                         residual staining, small fragments of stool or opaque                         liquid) and Left Colon = 2 (minor amount of residual                         staining, small fragments of stool and/or opaque                         liquid, but mucosa seen well). The total BBPS score                         equals 7. The quality of the bowel preparation was                         good. The ileocecal valve, appendiceal orifice, and                         rectum were photographed. Findings:      The perianal and digital rectal examinations were normal. Pertinent       negatives include  normal sphincter tone.      Three sessile polyps were found in the rectum (2) and ascending colon.       The polyps were 1 to 2 mm in size. These polyps were removed with a       jumbo cold forceps. Resection and retrieval were complete. Estimated       blood loss was minimal.      Multiple small-mouthed diverticula were found in the sigmoid colon.       Estimated blood loss: none.      The exam was otherwise without abnormality on direct and retroflexion       views. Impression:            - Three 1 to 2 mm polyps in the rectum and in the                         ascending colon, removed with a jumbo cold forceps.                         Resected and retrieved.                        - Diverticulosis in the sigmoid colon.                        - The examination was otherwise normal on direct and                         retroflexion views. Recommendation:        - Patient has a contact number available for                         emergencies. The signs and symptoms of potential                         delayed complications were discussed with the patient.                         Return to normal  activities tomorrow. Written                         discharge instructions were provided to the patient.                        - Discharge patient to home.                        - Resume previous diet.                        - Continue present medications.                        - Await pathology results.                        - Repeat colonoscopy for surveillance based on                         pathology results.                        -  Return to referring physician as previously                         scheduled.                        - The findings and recommendations were discussed with                         the patient. Procedure Code(s):     --- Professional ---                        [phone], Colonoscopy, flexible; with biopsy, single or                         multiple Diagnosis Code(s):     --- Professional ---                        Z80.0, Family history of malignant neoplasm of                         digestive organs                        D12.8, Benign neoplasm of rectum                        D12.2, Benign neoplasm of ascending colon                        K57.30, Diverticulosis of large intestine without                         perforation or abscess without bleeding CPT copyright 2022 [employer]. All rights reserved. The codes documented in this report are preliminary and upon coder review may  be revised to meet current compliance requirements. Attending Participation:      I personally performed the entire procedure. Elspeth Jungling, DO Elspeth Ozell Jungling DO, DO 06/14/2024 8:57:32 AM This report has been signed electronically. Number of Addenda: 0 Note Initiated On: 06/14/2024 6:48 AM Scope Withdrawal Time: 0 hours 15 minutes 22 seconds  Total Procedure Duration: 0 hours 21 minutes 38 seconds  Estimated Blood Loss:  Estimated blood loss was minimal.      [facility]

## 2024-06-14 NOTE — Anesthesia Preprocedure Evaluation (Addendum)
 "                                  Anesthesia Evaluation  Patient identified by MRN, date of birth, ID band Patient awake    Reviewed: Allergy & Precautions, NPO status , Patient's Chart, lab work & pertinent test results  Airway Mallampati: III  TM Distance: >3 FB Neck ROM: full    Dental  (+) Teeth Intact   Pulmonary Patient abstained from smoking., former smoker   Pulmonary exam normal        Cardiovascular Exercise Tolerance: Good hypertension, Pt. 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 No date: Hyperlipidemia No date: Hypertension 2013: Lump or mass in breast No date: Obesity, unspecified No date: Personal history of tobacco use, presenting hazards to health No date: Special screening for malignant neoplasms, colon 2000: Trigeminal neuralgia of left side of face No date: Type 2 diabetes mellitus (HCC) 2011: Urinary retention  Past Surgical History: No date: ABDOMINAL HYSTERECTOMY 2000: BRAIN SURGERY     Comment:  Trigeminal neuralgia 1980: BREAST BIOPSY; Right     Comment:  benign (cyst) per pt 1990: BREAST BIOPSY; Left     Comment:  benign 01/30/2021: BUNIONECTOMY; Right     Comment:  Procedure: ZELL HAI;  Surgeon: Ashley Soulier, DPM;  Location: [facility];  Service:               Podiatry;  Laterality:  Right; 01/22/2016: CARPAL TUNNEL RELEASE; Right     Comment:  Procedure: RIGHT CARPAL TUNNEL RELEASE;  Surgeon: Franky Curia, MD;  Location: [facility];                Service: Orthopedics;  Laterality: Right; 01/25/2017: CHONDROPLASTY     Comment:  Procedure: CHONDROPLASTY;  Surgeon: Marchia Franky,               MD;  Location: [facility];  Service: Orthopedics;;  humeral              head 2008: COLONOSCOPY     Comment:  Dr. Viktoria 01/25/2019: COLONOSCOPY WITH PROPOFOL ; N/A     Comment:  Procedure: COLONOSCOPY WITH PROPOFOL ;  Surgeon:               Gaylyn Gladis PENNER, MD;  Location: [facility];                Service: Endoscopy;  Laterality: N/A; No date: DESTRUCTION TRIGEMINAL NERVE VIA NEUROLYTIC AGENT No date: ELBOW SURGERY; Right 2010: HAND SURGERY; Left 2011: HAND SURGERY; Right 1998: KNEE SURGERY; Right 04/20/2016: MASS EXCISION; Left     Comment:  Procedure: EXCISION OSTEOPHYTE LEFT LONG FINGER  DEBRIDEMENT DISTAL INTERPHALANGEAL JOINT;  Surgeon: Franky Curia, MD;  Location: [facility];                Service: Orthopedics;  Laterality: Left;  EXCISION               OSTEOPHYTE LEFT LONG FINGER DEBRIDEMENT DISTAL               INTERPHALANGEAL JOINT No date: ROTATOR CUFF REPAIR; Right 01/25/2017: SHOULDER ARTHROSCOPY WITH OPEN ROTATOR CUFF REPAIR; Left     Comment:  Procedure: SHOULDER ARTHROSCOPY WITH OPEN ROTATOR CUFF               REPAIR;  Surgeon: Marchia Franky, MD;  Location: [facility]               ORS;  Service: Orthopedics;  Laterality: Left;  labral               debridement biceps tenotomy 10/27/2021: SHOULDER ARTHROSCOPY WITH OPEN ROTATOR CUFF REPAIR AND  DISTAL CLAVICLE ACROMINECTOMY; Right     Comment:  Procedure: SHOULDER ARTHROSCOPY WITH OPEN ROTATOR CUFF               REPAIR AND DISTAL CLAVICLE ACROMINECTOMY with BICEPS               TENOTOMY;  Surgeon: Marchia Franky, MD;  Location:  [facility]              ORS;  Service: Orthopedics;  Laterality: Right; No date: thumb surgery due to fracture No date: TOE SURGERY No date: TONSILLECTOMY 06/09/2012: TRIGGER FINGER RELEASE; Right     Comment:  ring finger 01/30/2021: WEIL OSTEOTOMY; Right     Comment:  Procedure: PHALANX OSTEOTOMY-AKIN;  Surgeon: Ashley Soulier, DPM;  Location: [facility];  Service:               Podiatry;  Laterality: Right;  Diabetic - oral meds  BMI    Body Mass Index: 29.41 kg/m      Reproductive/Obstetrics negative OB ROS                              Anesthesia Physical Anesthesia Plan  ASA: 2  Anesthesia Plan: General   Post-op Pain Management:    Induction: Intravenous  PONV Risk Score and Plan: Propofol  infusion and TIVA  Airway Management Planned: Natural Airway and Nasal Cannula  Additional Equipment:   Intra-op Plan:   Post-operative Plan:   Informed Consent: I have reviewed the patients History and Physical, chart, labs and discussed the procedure including the risks, benefits and alternatives for the proposed anesthesia with the patient or authorized representative who has indicated his/her understanding and acceptance.     Dental Advisory Given  Plan Discussed with: CRNA  Anesthesia Plan Comments:          Anesthesia Quick Evaluation  "

## 2024-06-14 NOTE — Anesthesia Postprocedure Evaluation (Signed)
"   Anesthesia Post Note  Patient: Desiree Oneal  Procedure(s) Performed: COLONOSCOPY  Patient location during evaluation: PACU Anesthesia Type: General Level of consciousness: awake Pain management: satisfactory to patient Vital Signs Assessment: post-procedure vital signs reviewed and stable Respiratory status: nonlabored ventilation Cardiovascular status: stable Anesthetic complications: no   No notable events documented.   Last Vitals:  Vitals:   06/14/24 0856 06/14/24 0916  BP: (!) 88/62 130/70  Pulse: 82   Resp: 20 19  Temp: (!) 35.8 C   SpO2: 97% 98%    Last Pain:  Vitals:   06/14/24 0856  TempSrc: Temporal  PainSc: 0-No pain                 VAN STAVEREN,Diarra Ceja      "

## 2024-06-14 NOTE — Interval H&P Note (Signed)
 History and Physical Interval Note: Preprocedure H&P from 06/14/24  was reviewed and there was no interval change after seeing and examining the patient.  Written consent was obtained from the patient after discussion of risks, benefits, and alternatives. Patient has consented to proceed with Colonoscopy with possible intervention   06/14/2024 8:16 AM  Desiree Oneal  has presented today for surgery, with the diagnosis of Colon cancer screening (Z12.11) Family history of colon cancer (Z80.0).  The various methods of treatment have been discussed with the patient and family. After consideration of risks, benefits and other options for treatment, the patient has consented to  Procedures: COLONOSCOPY (N/A) as a surgical intervention.  The patient's history has been reviewed, patient examined, no change in status, stable for surgery.  I have reviewed the patient's chart and labs.  Questions were answered to the patient's satisfaction.     Elspeth Ozell Jungling

## 2024-06-14 NOTE — Transfer of Care (Signed)
 Immediate Anesthesia Transfer of Care Note  Patient: Desiree Oneal  Procedure(s) Performed: COLONOSCOPY  Patient Location: PACU  Anesthesia Type:General  Level of Consciousness: awake, alert , drowsy, and patient cooperative  Airway & Oxygen Therapy: Patient Spontanous Breathing  Post-op Assessment: Report given to RN, Post -op Vital signs reviewed and stable, and Patient moving all extremities X 4  Post vital signs: Reviewed and stable  Last Vitals:  Vitals Value Taken Time  BP 88/62 06/14/24 08:56  Temp 35.8 C 06/14/24 08:56  Pulse 82 06/14/24 08:56  Resp 20 06/14/24 08:56  SpO2      Last Pain:  Vitals:   06/14/24 0856  TempSrc: Temporal  PainSc: 0-No pain         Complications: No notable events documented.

## 2024-06-18 LAB — SURGICAL PATHOLOGY
